# Patient Record
Sex: Female | Born: 1940 | Race: White | Hispanic: No | Marital: Married | Smoking: Never smoker
Health system: Southern US, Community
[De-identification: ages and names within clinical notes are randomized; demographics above are authoritative.]

## PROBLEM LIST (undated history)

## (undated) DIAGNOSIS — C50919 Malignant neoplasm of unspecified site of unspecified female breast: Secondary | ICD-10-CM

## (undated) DIAGNOSIS — R0600 Dyspnea, unspecified: Secondary | ICD-10-CM

## (undated) DIAGNOSIS — I10 Essential (primary) hypertension: Secondary | ICD-10-CM

## (undated) DIAGNOSIS — F329 Major depressive disorder, single episode, unspecified: Secondary | ICD-10-CM

## (undated) DIAGNOSIS — Z8619 Personal history of other infectious and parasitic diseases: Secondary | ICD-10-CM

## (undated) DIAGNOSIS — F419 Anxiety disorder, unspecified: Secondary | ICD-10-CM

## (undated) DIAGNOSIS — E785 Hyperlipidemia, unspecified: Secondary | ICD-10-CM

## (undated) DIAGNOSIS — K8020 Calculus of gallbladder without cholecystitis without obstruction: Secondary | ICD-10-CM

## (undated) DIAGNOSIS — Z860100 Personal history of colon polyps, unspecified: Secondary | ICD-10-CM

## (undated) DIAGNOSIS — E119 Type 2 diabetes mellitus without complications: Secondary | ICD-10-CM

## (undated) DIAGNOSIS — K219 Gastro-esophageal reflux disease without esophagitis: Secondary | ICD-10-CM

## (undated) DIAGNOSIS — M1990 Unspecified osteoarthritis, unspecified site: Secondary | ICD-10-CM

## (undated) DIAGNOSIS — F32A Depression, unspecified: Secondary | ICD-10-CM

## (undated) DIAGNOSIS — Z9221 Personal history of antineoplastic chemotherapy: Secondary | ICD-10-CM

## (undated) DIAGNOSIS — C801 Malignant (primary) neoplasm, unspecified: Secondary | ICD-10-CM

## (undated) DIAGNOSIS — R197 Diarrhea, unspecified: Secondary | ICD-10-CM

## (undated) DIAGNOSIS — D649 Anemia, unspecified: Secondary | ICD-10-CM

## (undated) DIAGNOSIS — H25019 Cortical age-related cataract, unspecified eye: Secondary | ICD-10-CM

## (undated) DIAGNOSIS — M109 Gout, unspecified: Secondary | ICD-10-CM

## (undated) HISTORY — PX: CHOLECYSTECTOMY: SHX55

## (undated) HISTORY — PX: BUNIONECTOMY: SHX129

## (undated) HISTORY — PX: HAMMER TOE SURGERY: SHX385

## (undated) HISTORY — PX: ABDOMINAL HYSTERECTOMY: SHX81

---

## 1971-07-19 DIAGNOSIS — C801 Malignant (primary) neoplasm, unspecified: Secondary | ICD-10-CM

## 1971-07-19 HISTORY — DX: Malignant (primary) neoplasm, unspecified: C80.1

## 2009-07-18 DIAGNOSIS — C50919 Malignant neoplasm of unspecified site of unspecified female breast: Secondary | ICD-10-CM

## 2009-07-18 HISTORY — PX: MASTECTOMY: SHX3

## 2009-07-18 HISTORY — DX: Malignant neoplasm of unspecified site of unspecified female breast: C50.919

## 2010-07-18 HISTORY — PX: REDUCTION MAMMAPLASTY: SUR839

## 2011-01-16 ENCOUNTER — Ambulatory Visit: Payer: Self-pay | Admitting: Internal Medicine

## 2011-01-17 ENCOUNTER — Ambulatory Visit: Payer: Self-pay | Admitting: Internal Medicine

## 2011-02-01 LAB — CANCER ANTIGEN 27.29: CA 27.29: 35.9 U/mL (ref 0.0–38.6)

## 2011-02-16 ENCOUNTER — Ambulatory Visit: Payer: Self-pay | Admitting: Internal Medicine

## 2011-03-29 ENCOUNTER — Ambulatory Visit: Payer: Self-pay | Admitting: Internal Medicine

## 2011-07-25 ENCOUNTER — Ambulatory Visit: Payer: Self-pay | Admitting: Internal Medicine

## 2011-07-25 LAB — CBC CANCER CENTER
Basophil %: 0.5 %
Eosinophil #: 0.3 x10 3/mm (ref 0.0–0.7)
Eosinophil %: 5.3 %
HGB: 12.1 g/dL (ref 12.0–16.0)
Lymphocyte #: 1.7 x10 3/mm (ref 1.0–3.6)
Lymphocyte %: 27.5 %
MCHC: 32.6 g/dL (ref 32.0–36.0)
MCV: 78.3 fL — ABNORMAL LOW (ref 80–100)
Monocyte #: 0.2 x10 3/mm (ref 0.0–0.7)
Monocyte %: 3.3 %
Neutrophil #: 4 x10 3/mm (ref 1.4–6.5)
Neutrophil %: 63.4 %
RBC: 4.76 10*6/uL (ref 3.80–5.20)
RDW: 15.2 % — ABNORMAL HIGH (ref 11.5–14.5)
WBC: 6.2 x10 3/mm (ref 3.6–11.0)

## 2011-07-25 LAB — COMPREHENSIVE METABOLIC PANEL
Albumin: 3.6 g/dL (ref 3.4–5.0)
Calcium, Total: 8.7 mg/dL (ref 8.5–10.1)
Chloride: 105 mmol/L (ref 98–107)
Co2: 23 mmol/L (ref 21–32)
EGFR (African American): >60
Glucose: 204 mg/dL — ABNORMAL HIGH (ref 65–99)
Potassium: 4 mmol/L (ref 3.5–5.1)
Sodium: 138 mmol/L (ref 136–145)

## 2011-08-19 ENCOUNTER — Ambulatory Visit: Payer: Self-pay | Admitting: Internal Medicine

## 2011-12-08 ENCOUNTER — Ambulatory Visit: Payer: Self-pay | Admitting: Internal Medicine

## 2012-01-26 ENCOUNTER — Ambulatory Visit: Payer: Self-pay | Admitting: Internal Medicine

## 2012-01-26 LAB — CBC CANCER CENTER
Eosinophil %: 2.9 %
Lymphocyte #: 1 x10 3/mm (ref 1.0–3.6)
MCH: 26.5 pg (ref 26.0–34.0)
MCV: 81 fL (ref 80–100)
Monocyte #: 0.4 x10 3/mm (ref 0.2–0.9)
Monocyte %: 5.1 %
Neutrophil #: 6.1 x10 3/mm (ref 1.4–6.5)
Neutrophil %: 78.8 %
Platelet: 165 x10 3/mm (ref 150–440)
RDW: 17.7 % — ABNORMAL HIGH (ref 11.5–14.5)
WBC: 7.7 x10 3/mm (ref 3.6–11.0)

## 2012-01-26 LAB — COMPREHENSIVE METABOLIC PANEL
Albumin: 3.6 g/dL (ref 3.4–5.0)
Alkaline Phosphatase: 111 U/L (ref 50–136)
BUN: 26 mg/dL — ABNORMAL HIGH (ref 7–18)
Calcium, Total: 9.1 mg/dL (ref 8.5–10.1)
Co2: 20 mmol/L — ABNORMAL LOW (ref 21–32)
Creatinine: 1.15 mg/dL (ref 0.60–1.30)
EGFR (African American): 55 — ABNORMAL LOW
EGFR (Non-African Amer.): 48 — ABNORMAL LOW
Glucose: 128 mg/dL — ABNORMAL HIGH (ref 65–99)
Potassium: 4.4 mmol/L (ref 3.5–5.1)
SGOT(AST): 30 U/L (ref 15–37)
SGPT (ALT): 35 U/L
Sodium: 138 mmol/L (ref 136–145)
Total Protein: 7.7 g/dL (ref 6.4–8.2)

## 2012-02-16 ENCOUNTER — Ambulatory Visit: Payer: Self-pay | Admitting: Internal Medicine

## 2012-03-18 ENCOUNTER — Ambulatory Visit: Payer: Self-pay | Admitting: Internal Medicine

## 2012-04-17 ENCOUNTER — Ambulatory Visit: Payer: Self-pay | Admitting: Internal Medicine

## 2013-08-15 ENCOUNTER — Ambulatory Visit: Payer: Self-pay | Admitting: Family Medicine

## 2013-10-29 DIAGNOSIS — E785 Hyperlipidemia, unspecified: Secondary | ICD-10-CM | POA: Insufficient documentation

## 2013-10-29 DIAGNOSIS — I8393 Asymptomatic varicose veins of bilateral lower extremities: Secondary | ICD-10-CM | POA: Insufficient documentation

## 2013-10-29 DIAGNOSIS — Z8541 Personal history of malignant neoplasm of cervix uteri: Secondary | ICD-10-CM | POA: Insufficient documentation

## 2014-01-10 ENCOUNTER — Ambulatory Visit: Payer: Self-pay | Admitting: Gastroenterology

## 2014-01-13 LAB — PATHOLOGY REPORT

## 2014-03-13 DIAGNOSIS — M170 Bilateral primary osteoarthritis of knee: Secondary | ICD-10-CM | POA: Insufficient documentation

## 2014-06-20 DIAGNOSIS — H9193 Unspecified hearing loss, bilateral: Secondary | ICD-10-CM | POA: Insufficient documentation

## 2014-08-28 DIAGNOSIS — G8929 Other chronic pain: Secondary | ICD-10-CM | POA: Insufficient documentation

## 2014-09-11 ENCOUNTER — Ambulatory Visit: Payer: Self-pay | Admitting: Unknown Physician Specialty

## 2014-09-26 ENCOUNTER — Encounter: Payer: Self-pay | Admitting: Gastroenterology

## 2015-07-01 DIAGNOSIS — F418 Other specified anxiety disorders: Secondary | ICD-10-CM | POA: Insufficient documentation

## 2015-12-31 ENCOUNTER — Other Ambulatory Visit: Payer: Self-pay | Admitting: Family Medicine

## 2015-12-31 DIAGNOSIS — Z1231 Encounter for screening mammogram for malignant neoplasm of breast: Secondary | ICD-10-CM

## 2015-12-31 DIAGNOSIS — E669 Obesity, unspecified: Secondary | ICD-10-CM | POA: Insufficient documentation

## 2016-01-18 ENCOUNTER — Ambulatory Visit: Admission: RE | Admit: 2016-01-18 | Payer: Self-pay | Source: Ambulatory Visit

## 2016-01-28 ENCOUNTER — Ambulatory Visit
Admission: RE | Admit: 2016-01-28 | Discharge: 2016-01-28 | Disposition: A | Discharge status: Home / Self Care | Payer: Medicare Other | Source: Ambulatory Visit | Attending: Family Medicine | Admitting: Family Medicine

## 2016-01-28 DIAGNOSIS — Z1231 Encounter for screening mammogram for malignant neoplasm of breast: Secondary | ICD-10-CM | POA: Diagnosis not present

## 2016-01-28 HISTORY — DX: Malignant neoplasm of unspecified site of unspecified female breast: C50.919

## 2016-01-28 HISTORY — DX: Malignant (primary) neoplasm, unspecified: C80.1

## 2016-09-27 ENCOUNTER — Encounter
Admission: RE | Admit: 2016-09-27 | Discharge: 2016-09-27 | Disposition: A | Discharge status: Home / Self Care | Payer: Medicare Other | Source: Ambulatory Visit | Attending: Surgery | Admitting: Surgery

## 2016-09-27 DIAGNOSIS — Z01810 Encounter for preprocedural cardiovascular examination: Secondary | ICD-10-CM | POA: Diagnosis present

## 2016-09-27 DIAGNOSIS — I517 Cardiomegaly: Secondary | ICD-10-CM | POA: Insufficient documentation

## 2016-09-27 DIAGNOSIS — Z01812 Encounter for preprocedural laboratory examination: Secondary | ICD-10-CM | POA: Insufficient documentation

## 2016-09-27 DIAGNOSIS — M75121 Complete rotator cuff tear or rupture of right shoulder, not specified as traumatic: Secondary | ICD-10-CM | POA: Diagnosis not present

## 2016-09-27 HISTORY — DX: Depression, unspecified: F32.A

## 2016-09-27 HISTORY — DX: Type 2 diabetes mellitus without complications: E11.9

## 2016-09-27 HISTORY — DX: Major depressive disorder, single episode, unspecified: F32.9

## 2016-09-27 HISTORY — DX: Diarrhea, unspecified: R19.7

## 2016-09-27 HISTORY — DX: Dyspnea, unspecified: R06.00

## 2016-09-27 HISTORY — DX: Essential (primary) hypertension: I10

## 2016-09-27 HISTORY — DX: Gastro-esophageal reflux disease without esophagitis: K21.9

## 2016-09-27 LAB — BASIC METABOLIC PANEL
ANION GAP: 8 (ref 5–15)
BUN: 33 mg/dL — ABNORMAL HIGH (ref 6–20)
CALCIUM: 9.2 mg/dL (ref 8.9–10.3)
CO2: 21 mmol/L — AB (ref 22–32)
Chloride: 110 mmol/L (ref 101–111)
Creatinine, Ser: 1.1 mg/dL — ABNORMAL HIGH (ref 0.44–1.00)
GFR calc non Af Amer: 48 mL/min — ABNORMAL LOW (ref >60)
GFR, EST AFRICAN AMERICAN: 55 mL/min — AB (ref >60)
Glucose, Bld: 113 mg/dL — ABNORMAL HIGH (ref 65–99)
Potassium: 4 mmol/L (ref 3.5–5.1)
SODIUM: 139 mmol/L (ref 135–145)

## 2016-09-27 LAB — CBC
HEMATOCRIT: 39.4 % (ref 35.0–47.0)
HEMOGLOBIN: 12.8 g/dL (ref 12.0–16.0)
MCH: 26 pg (ref 26.0–34.0)
MCHC: 32.6 g/dL (ref 32.0–36.0)
MCV: 79.9 fL — ABNORMAL LOW (ref 80.0–100.0)
Platelets: 154 10*3/uL (ref 150–440)
RBC: 4.93 MIL/uL (ref 3.80–5.20)
RDW: 15.8 % — AB (ref 11.5–14.5)
WBC: 8.6 10*3/uL (ref 3.6–11.0)

## 2016-09-27 LAB — URINALYSIS, COMPLETE (UACMP) WITH MICROSCOPIC
Bilirubin Urine: NEGATIVE
GLUCOSE, UA: NEGATIVE mg/dL
Hgb urine dipstick: NEGATIVE
KETONES UR: NEGATIVE mg/dL
Nitrite: NEGATIVE
PH: 5 (ref 5.0–8.0)
Protein, ur: NEGATIVE mg/dL
SPECIFIC GRAVITY, URINE: 1.016 (ref 1.005–1.030)
SQUAMOUS EPITHELIAL / LPF: NONE SEEN

## 2016-09-27 LAB — TYPE AND SCREEN
ABO/RH(D): O NEG
ANTIBODY SCREEN: NEGATIVE

## 2016-09-27 LAB — SURGICAL PCR SCREEN
MRSA, PCR: NEGATIVE
STAPHYLOCOCCUS AUREUS: NEGATIVE

## 2016-09-27 LAB — PROTIME-INR
INR: 1.12
PROTHROMBIN TIME: 14.5 s (ref 11.4–15.2)

## 2016-09-27 NOTE — Patient Instructions (Signed)
Your procedure is scheduled on: 10/04/16 Tues Report to Same Day Surgery 2nd floor medical mall [facility] Entrance-take elevator on left to 2nd floor.  Check in with surgery information desk.) To find out your arrival time please call [phone] between 1PM - 3PM on 10/03/16 Mon  Remember: Instructions that are not followed completely may result in serious medical risk, up to and including death, or upon the discretion of your surgeon and anesthesiologist your surgery may need to be rescheduled.    _x___ 1. Do not eat food or drink liquids after midnight. No gum chewing or                              hard candies.     __x__ 2. No Alcohol for 24 hours before or after surgery.   __x__3. No Smoking for 24 prior to surgery.   ____  4. Bring all medications with you on the day of surgery if instructed.    __x__ 5. Notify your doctor if there is any change in your medical condition     (cold, fever, infections).     Do not wear jewelry, make-up, hairpins, clips or nail polish.  Do not wear lotions, powders, or perfumes. You may wear deodorant.  Do not shave 48 hours prior to surgery. Men may shave face and neck.  Do not bring valuables to the hospital.    [facility] is not responsible for any belongings or valuables.               Contacts, dentures or bridgework may not be worn into surgery.  Leave your suitcase in the car. After surgery it may be brought to your room.  For patients admitted to the hospital, discharge time is determined by your                       treatment team.   Patients discharged the day of surgery will not be allowed to drive home.  You will need someone to drive you home and stay with you the night of your procedure.    Please read over the following fact sheets that you were given:   [facility] Preparing for Surgery and or MRSA Information   _x___ Take anti-hypertensive (unless it includes a diuretic), cardiac, seizure, asthma,     anti-reflux and  psychiatric medicines. These include:  1. lisinopril (PRINIVIL,ZESTRIL  2.omeprazole (PRILOSEC  3.sertraline (ZOLOFT  4.  5.  6.  ____Fleets enema or Magnesium Citrate as directed.   _x___ Use CHG Soap or sage wipes as directed on instruction sheet   ____ Use inhalers on the day of surgery and bring to hospital day of surgery  __x__ Stop Metformin and Janumet 2 days prior to surgery.    ____ Take 1/2 of usual insulin dose the night before surgery and none on the morning     surgery.   _x___ Follow recommendations from Cardiologist, Pulmonologist or PCP regarding          stopping Aspirin, Coumadin, Pllavix ,Eliquis, Effient, or Pradaxa, and Pletal. Stop Aspirin until after surgery.  X____Stop Anti-inflammatories such as Advil, Aleve, Ibuprofen, Motrin, Naproxen, Naprosyn, Goodies powders or aspirin products. OK to take Tylenol and                          Celebrex.   _x___ Stop supplements until after surgery.  But may continue Vitamin D, Vitamin B,       and multivitamin.   ____ Bring C-Pap to the hospital.

## 2016-09-28 LAB — URINE CULTURE: Culture: <10000 — AB

## 2016-09-28 NOTE — Pre-Procedure Instructions (Signed)
MEDICAL CLEARANCE REQUEST/EKG,AS REQUESTED BY DR PENWARDEN,CALLED AND FAXED TO DR Kandice Robinsons. SPOKE WITH MEGAN AND DR Kandice Robinsons LEFT AND ANOTHER DOCTOR IN PRACTICE WILL ADDRESS. FAXED FYT TO DR Roland Rack

## 2016-10-03 MED ORDER — CEFAZOLIN SODIUM-DEXTROSE 2-4 GM/100ML-% IV SOLN
2.0000 g | Freq: Once | INTRAVENOUS | Status: AC
  Administered 2016-10-04: 2 g via INTRAVENOUS

## 2016-10-03 NOTE — Pre-Procedure Instructions (Signed)
CLEARED BY DR PARASCHOS LOW RISK 09/30/16

## 2016-10-04 ENCOUNTER — Inpatient Hospital Stay: Payer: Medicare Other | Admitting: Anesthesiology

## 2016-10-04 ENCOUNTER — Encounter
Admission: RE | Disposition: A | Discharge status: Home Health | Payer: Self-pay | Source: Ambulatory Visit | Attending: Surgery

## 2016-10-04 ENCOUNTER — Inpatient Hospital Stay: Payer: Medicare Other

## 2016-10-04 ENCOUNTER — Inpatient Hospital Stay
Admission: RE | Admit: 2016-10-04 | Discharge: 2016-10-05 | DRG: 483 | Disposition: A | Discharge status: Home Health | Payer: Medicare Other | Source: Ambulatory Visit | Attending: Surgery | Admitting: Surgery

## 2016-10-04 ENCOUNTER — Encounter: Payer: Self-pay | Admitting: *Deleted

## 2016-10-04 DIAGNOSIS — Z23 Encounter for immunization: Secondary | ICD-10-CM | POA: Diagnosis present

## 2016-10-04 DIAGNOSIS — Z7984 Long term (current) use of oral hypoglycemic drugs: Secondary | ICD-10-CM

## 2016-10-04 DIAGNOSIS — Z853 Personal history of malignant neoplasm of breast: Secondary | ICD-10-CM | POA: Diagnosis not present

## 2016-10-04 DIAGNOSIS — M19011 Primary osteoarthritis, right shoulder: Secondary | ICD-10-CM | POA: Diagnosis present

## 2016-10-04 DIAGNOSIS — Z96611 Presence of right artificial shoulder joint: Secondary | ICD-10-CM

## 2016-10-04 DIAGNOSIS — Z7982 Long term (current) use of aspirin: Secondary | ICD-10-CM

## 2016-10-04 DIAGNOSIS — Z79899 Other long term (current) drug therapy: Secondary | ICD-10-CM | POA: Diagnosis not present

## 2016-10-04 DIAGNOSIS — Z8249 Family history of ischemic heart disease and other diseases of the circulatory system: Secondary | ICD-10-CM | POA: Diagnosis not present

## 2016-10-04 DIAGNOSIS — I10 Essential (primary) hypertension: Secondary | ICD-10-CM | POA: Diagnosis present

## 2016-10-04 DIAGNOSIS — F329 Major depressive disorder, single episode, unspecified: Secondary | ICD-10-CM | POA: Diagnosis present

## 2016-10-04 DIAGNOSIS — Z8541 Personal history of malignant neoplasm of cervix uteri: Secondary | ICD-10-CM | POA: Diagnosis not present

## 2016-10-04 DIAGNOSIS — Z86010 Personal history of colonic polyps: Secondary | ICD-10-CM | POA: Diagnosis not present

## 2016-10-04 DIAGNOSIS — K219 Gastro-esophageal reflux disease without esophagitis: Secondary | ICD-10-CM | POA: Diagnosis present

## 2016-10-04 DIAGNOSIS — M75101 Unspecified rotator cuff tear or rupture of right shoulder, not specified as traumatic: Principal | ICD-10-CM | POA: Diagnosis present

## 2016-10-04 DIAGNOSIS — K5909 Other constipation: Secondary | ICD-10-CM | POA: Diagnosis present

## 2016-10-04 DIAGNOSIS — M25511 Pain in right shoulder: Secondary | ICD-10-CM | POA: Diagnosis present

## 2016-10-04 HISTORY — PX: REVERSE SHOULDER ARTHROPLASTY: SHX5054

## 2016-10-04 LAB — GLUCOSE, CAPILLARY
GLUCOSE-CAPILLARY: 128 mg/dL — AB (ref 65–99)
Glucose-Capillary: 148 mg/dL — ABNORMAL HIGH (ref 65–99)
Glucose-Capillary: 115 mg/dL — ABNORMAL HIGH (ref 65–99)
Glucose-Capillary: 126 mg/dL — ABNORMAL HIGH (ref 65–99)

## 2016-10-04 LAB — ABO/RH: ABO/RH(D): O NEG

## 2016-10-04 SURGERY — ARTHROPLASTY, SHOULDER, TOTAL, REVERSE
Anesthesia: Regional | Site: Shoulder | Laterality: Right | Wound class: Clean

## 2016-10-04 MED ORDER — PANTOPRAZOLE SODIUM 40 MG PO TBEC
40.0000 mg | DELAYED_RELEASE_TABLET | Freq: Two times a day (BID) | ORAL | Status: DC
  Administered 2016-10-04 – 2016-10-05 (×2): 40 mg via ORAL
  Filled 2016-10-04 (×2): qty 1

## 2016-10-04 MED ORDER — SERTRALINE HCL 50 MG PO TABS
150.0000 mg | ORAL_TABLET | Freq: Every day | ORAL | Status: DC
  Administered 2016-10-04 – 2016-10-05 (×2): 150 mg via ORAL
  Filled 2016-10-04 (×2): qty 1

## 2016-10-04 MED ORDER — DOCUSATE SODIUM 100 MG PO CAPS
100.0000 mg | ORAL_CAPSULE | Freq: Two times a day (BID) | ORAL | Status: DC
  Administered 2016-10-04 – 2016-10-05 (×2): 100 mg via ORAL
  Filled 2016-10-04 (×2): qty 1

## 2016-10-04 MED ORDER — PHENYLEPHRINE HCL 10 MG/ML IJ SOLN
INTRAMUSCULAR | Status: DC | PRN
  Administered 2016-10-04: 25 ug/min via INTRAVENOUS

## 2016-10-04 MED ORDER — FENTANYL CITRATE (PF) 250 MCG/5ML IJ SOLN
INTRAMUSCULAR | Status: AC
  Filled 2016-10-04: qty 5

## 2016-10-04 MED ORDER — METOCLOPRAMIDE HCL 5 MG/ML IJ SOLN: 5.0000 mg | Freq: Three times a day (TID) | INTRAMUSCULAR | Status: DC | PRN

## 2016-10-04 MED ORDER — ACETAMINOPHEN 325 MG PO TABS: 650.0000 mg | ORAL_TABLET | Freq: Four times a day (QID) | ORAL | Status: DC | PRN

## 2016-10-04 MED ORDER — LIDOCAINE HCL (PF) 1 % IJ SOLN
INTRAMUSCULAR | Status: DC | PRN
  Administered 2016-10-04: 1 mL via INTRADERMAL

## 2016-10-04 MED ORDER — LIDOCAINE HCL (PF) 2 % IJ SOLN
INTRAMUSCULAR | Status: AC
  Filled 2016-10-04: qty 2

## 2016-10-04 MED ORDER — LIDOCAINE HCL (CARDIAC) 20 MG/ML IV SOLN
INTRAVENOUS | Status: DC | PRN
  Administered 2016-10-04 (×2): 100 mg via INTRAVENOUS

## 2016-10-04 MED ORDER — ACETAMINOPHEN 650 MG RE SUPP: 650.0000 mg | Freq: Four times a day (QID) | RECTAL | Status: DC | PRN

## 2016-10-04 MED ORDER — DIPHENHYDRAMINE HCL 12.5 MG/5ML PO ELIX: 12.5000 mg | ORAL_SOLUTION | ORAL | Status: DC | PRN

## 2016-10-04 MED ORDER — FENTANYL CITRATE (PF) 100 MCG/2ML IJ SOLN
50.0000 ug | Freq: Once | INTRAMUSCULAR | Status: AC
  Administered 2016-10-04: 50 ug via INTRAVENOUS

## 2016-10-04 MED ORDER — LIDOCAINE HCL (PF) 1 % IJ SOLN
INTRAMUSCULAR | Status: AC
  Filled 2016-10-04: qty 5

## 2016-10-04 MED ORDER — DIPHENOXYLATE-ATROPINE 2.5-0.025 MG PO TABS
1.0000 | ORAL_TABLET | Freq: Two times a day (BID) | ORAL | Status: DC
  Administered 2016-10-04 – 2016-10-05 (×2): 1 via ORAL
  Filled 2016-10-04 (×2): qty 1

## 2016-10-04 MED ORDER — POTASSIUM CHLORIDE IN NACL 20-0.9 MEQ/L-% IV SOLN
INTRAVENOUS | Status: DC
Start: 2016-10-04 — End: 2016-10-05
  Administered 2016-10-04 – 2016-10-05 (×2): via INTRAVENOUS
  Filled 2016-10-04 (×4): qty 1000

## 2016-10-04 MED ORDER — METFORMIN HCL 500 MG PO TABS
500.0000 mg | ORAL_TABLET | Freq: Every day | ORAL | Status: DC
  Administered 2016-10-04: 500 mg via ORAL
  Filled 2016-10-04: qty 1

## 2016-10-04 MED ORDER — LISINOPRIL 20 MG PO TABS
25.0000 mg | ORAL_TABLET | Freq: Every day | ORAL | Status: DC
  Administered 2016-10-04 – 2016-10-05 (×2): 25 mg via ORAL
  Filled 2016-10-04 (×2): qty 1

## 2016-10-04 MED ORDER — ROCURONIUM BROMIDE 100 MG/10ML IV SOLN
INTRAVENOUS | Status: DC | PRN
  Administered 2016-10-04: 20 mg via INTRAVENOUS
  Administered 2016-10-04: 50 mg via INTRAVENOUS

## 2016-10-04 MED ORDER — VITAMIN A 8000 UNITS PO CAPS: 8000.0000 [IU] | ORAL_CAPSULE | Freq: Every day | ORAL | Status: DC

## 2016-10-04 MED ORDER — NEOMYCIN-POLYMYXIN B GU 40-200000 IR SOLN
Status: DC | PRN
  Administered 2016-10-04 (×2): 16 mL

## 2016-10-04 MED ORDER — PNEUMOCOCCAL VAC POLYVALENT 25 MCG/0.5ML IJ INJ
0.5000 mL | INJECTION | INTRAMUSCULAR | Status: AC
  Administered 2016-10-05: 0.5 mL via INTRAMUSCULAR
  Filled 2016-10-04: qty 0.5

## 2016-10-04 MED ORDER — VITAMIN E 180 MG (400 UNIT) PO CAPS
400.0000 [IU] | ORAL_CAPSULE | Freq: Every day | ORAL | Status: DC
  Administered 2016-10-04: 400 [IU] via ORAL
  Filled 2016-10-04: qty 1

## 2016-10-04 MED ORDER — ROCURONIUM BROMIDE 50 MG/5ML IV SOLN
INTRAVENOUS | Status: AC
  Filled 2016-10-04: qty 1

## 2016-10-04 MED ORDER — ROPIVACAINE HCL 5 MG/ML IJ SOLN
INTRAMUSCULAR | Status: AC
  Filled 2016-10-04: qty 40

## 2016-10-04 MED ORDER — MIDAZOLAM HCL 2 MG/2ML IJ SOLN
1.0000 mg | Freq: Once | INTRAMUSCULAR | Status: AC
  Administered 2016-10-04: 1 mg via INTRAVENOUS

## 2016-10-04 MED ORDER — PROPOFOL 10 MG/ML IV BOLUS
INTRAVENOUS | Status: DC | PRN
Start: 2016-10-04 — End: 2016-10-04
  Administered 2016-10-04: 50 mg via INTRAVENOUS
  Administered 2016-10-04: 150 mg via INTRAVENOUS

## 2016-10-04 MED ORDER — TRANEXAMIC ACID 1000 MG/10ML IV SOLN
INTRAVENOUS | Status: DC | PRN
  Administered 2016-10-04: 1000 mg via INTRAVENOUS

## 2016-10-04 MED ORDER — ZINC SULFATE 220 (50 ZN) MG PO CAPS
220.0000 mg | ORAL_CAPSULE | Freq: Every day | ORAL | Status: DC
  Administered 2016-10-04: 220 mg via ORAL
  Filled 2016-10-04 (×2): qty 1

## 2016-10-04 MED ORDER — MAGNESIUM HYDROXIDE 400 MG/5ML PO SUSP: 30.0000 mL | Freq: Every day | ORAL | Status: DC | PRN

## 2016-10-04 MED ORDER — ONDANSETRON HCL 4 MG/2ML IJ SOLN
INTRAMUSCULAR | Status: DC | PRN
  Administered 2016-10-04: 4 mg via INTRAVENOUS

## 2016-10-04 MED ORDER — SODIUM CHLORIDE 0.9 % IJ SOLN
INTRAMUSCULAR | Status: AC
  Filled 2016-10-04: qty 50

## 2016-10-04 MED ORDER — FENTANYL CITRATE (PF) 100 MCG/2ML IJ SOLN
INTRAMUSCULAR | Status: AC
  Administered 2016-10-04: 50 ug via INTRAVENOUS
  Filled 2016-10-04: qty 2

## 2016-10-04 MED ORDER — ENOXAPARIN SODIUM 40 MG/0.4ML ~~LOC~~ SOLN
40.0000 mg | SUBCUTANEOUS | Status: DC
  Administered 2016-10-05: 40 mg via SUBCUTANEOUS
  Filled 2016-10-04: qty 0.4

## 2016-10-04 MED ORDER — FLEET ENEMA 7-19 GM/118ML RE ENEM: 1.0000 | ENEMA | Freq: Once | RECTAL | Status: DC | PRN

## 2016-10-04 MED ORDER — HYDROMORPHONE HCL 1 MG/ML IJ SOLN
0.5000 mg | INTRAMUSCULAR | Status: DC | PRN
  Administered 2016-10-05: 1 mg via INTRAVENOUS
  Filled 2016-10-04: qty 1

## 2016-10-04 MED ORDER — FENTANYL CITRATE (PF) 100 MCG/2ML IJ SOLN: 25.0000 ug | INTRAMUSCULAR | Status: DC | PRN

## 2016-10-04 MED ORDER — MIDAZOLAM HCL 2 MG/2ML IJ SOLN
INTRAMUSCULAR | Status: AC
  Administered 2016-10-04: 1 mg via INTRAVENOUS
  Filled 2016-10-04: qty 2

## 2016-10-04 MED ORDER — INFLUENZA VAC SPLIT QUAD 0.5 ML IM SUSY
0.5000 mL | PREFILLED_SYRINGE | INTRAMUSCULAR | Status: AC
  Administered 2016-10-05: 0.5 mL via INTRAMUSCULAR
  Filled 2016-10-04: qty 0.5

## 2016-10-04 MED ORDER — SUGAMMADEX SODIUM 200 MG/2ML IV SOLN
INTRAVENOUS | Status: AC
  Filled 2016-10-04: qty 2

## 2016-10-04 MED ORDER — METOCLOPRAMIDE HCL 10 MG PO TABS: 5.0000 mg | ORAL_TABLET | Freq: Three times a day (TID) | ORAL | Status: DC | PRN

## 2016-10-04 MED ORDER — ACETAMINOPHEN 500 MG PO TABS
1000.0000 mg | ORAL_TABLET | Freq: Four times a day (QID) | ORAL | Status: AC
  Administered 2016-10-04 – 2016-10-05 (×4): 1000 mg via ORAL
  Filled 2016-10-04 (×4): qty 2

## 2016-10-04 MED ORDER — BUPIVACAINE-EPINEPHRINE (PF) 0.5% -1:200000 IJ SOLN
INTRAMUSCULAR | Status: DC | PRN
  Administered 2016-10-04: 30 mL via PERINEURAL

## 2016-10-04 MED ORDER — ONDANSETRON HCL 4 MG PO TABS: 4.0000 mg | ORAL_TABLET | Freq: Four times a day (QID) | ORAL | Status: DC | PRN

## 2016-10-04 MED ORDER — PHENYLEPHRINE HCL 10 MG/ML IJ SOLN
INTRAMUSCULAR | Status: DC | PRN
  Administered 2016-10-04 (×2): 100 ug via INTRAVENOUS
  Administered 2016-10-04 (×2): 200 ug via INTRAVENOUS

## 2016-10-04 MED ORDER — FENTANYL CITRATE (PF) 100 MCG/2ML IJ SOLN
INTRAMUSCULAR | Status: DC | PRN
  Administered 2016-10-04 (×2): 50 ug via INTRAVENOUS
  Administered 2016-10-04: 100 ug via INTRAVENOUS

## 2016-10-04 MED ORDER — ONDANSETRON HCL 4 MG/2ML IJ SOLN: 4.0000 mg | Freq: Four times a day (QID) | INTRAMUSCULAR | Status: DC | PRN

## 2016-10-04 MED ORDER — INSULIN ASPART 100 UNIT/ML ~~LOC~~ SOLN: 0.0000 [IU] | Freq: Every day | SUBCUTANEOUS | Status: DC

## 2016-10-04 MED ORDER — SODIUM CHLORIDE 0.9 % IV SOLN
INTRAVENOUS | Status: DC | PRN
  Administered 2016-10-04: 60 mL

## 2016-10-04 MED ORDER — SUGAMMADEX SODIUM 200 MG/2ML IV SOLN
INTRAVENOUS | Status: DC | PRN
  Administered 2016-10-04: 200 mg via INTRAVENOUS

## 2016-10-04 MED ORDER — ROPIVACAINE HCL 5 MG/ML IJ SOLN
INTRAMUSCULAR | Status: DC | PRN
  Administered 2016-10-04: 10 mL via PERINEURAL
  Administered 2016-10-04: 20 mL via PERINEURAL

## 2016-10-04 MED ORDER — OXYCODONE HCL 5 MG PO TABS: 5.0000 mg | ORAL_TABLET | Freq: Once | ORAL | Status: DC | PRN

## 2016-10-04 MED ORDER — VASOPRESSIN 20 UNIT/ML IV SOLN
INTRAVENOUS | Status: DC | PRN
  Administered 2016-10-04: 2 [IU] via INTRAVENOUS

## 2016-10-04 MED ORDER — INSULIN ASPART 100 UNIT/ML ~~LOC~~ SOLN
0.0000 [IU] | Freq: Three times a day (TID) | SUBCUTANEOUS | Status: DC
  Administered 2016-10-05 (×2): 2 [IU] via SUBCUTANEOUS
  Filled 2016-10-04 (×2): qty 2

## 2016-10-04 MED ORDER — BISACODYL 10 MG RE SUPP: 10.0000 mg | Freq: Every day | RECTAL | Status: DC | PRN

## 2016-10-04 MED ORDER — OXYCODONE HCL 5 MG/5ML PO SOLN: 5.0000 mg | Freq: Once | ORAL | Status: DC | PRN

## 2016-10-04 MED ORDER — SODIUM CHLORIDE 0.9 % IV SOLN
INTRAVENOUS | Status: DC
  Administered 2016-10-04 (×2): via INTRAVENOUS
  Administered 2016-10-04: 1000 mL via INTRAVENOUS

## 2016-10-04 MED ORDER — PROPOFOL 10 MG/ML IV BOLUS
INTRAVENOUS | Status: AC
  Filled 2016-10-04: qty 20

## 2016-10-04 MED ORDER — OXYCODONE HCL 5 MG PO TABS
5.0000 mg | ORAL_TABLET | ORAL | Status: DC | PRN
  Administered 2016-10-05 (×3): 10 mg via ORAL
  Administered 2016-10-05 (×2): 5 mg via ORAL
  Filled 2016-10-04: qty 1
  Filled 2016-10-04: qty 2
  Filled 2016-10-04: qty 1
  Filled 2016-10-04 (×2): qty 2

## 2016-10-04 MED ORDER — CEFAZOLIN SODIUM-DEXTROSE 2-3 GM-% IV SOLR
2.0000 g | Freq: Four times a day (QID) | INTRAVENOUS | Status: AC
  Administered 2016-10-04 – 2016-10-05 (×3): 2 g via INTRAVENOUS
  Filled 2016-10-04 (×3): qty 50

## 2016-10-04 MED ORDER — ONDANSETRON HCL 4 MG/2ML IJ SOLN
INTRAMUSCULAR | Status: AC
  Filled 2016-10-04: qty 2

## 2016-10-04 MED ORDER — ASPIRIN EC 81 MG PO TBEC
81.0000 mg | DELAYED_RELEASE_TABLET | Freq: Every day | ORAL | Status: DC
  Administered 2016-10-04: 81 mg via ORAL
  Filled 2016-10-04: qty 1

## 2016-10-04 MED ORDER — CEFAZOLIN SODIUM-DEXTROSE 2-4 GM/100ML-% IV SOLN: 2.0000 g | Freq: Four times a day (QID) | INTRAVENOUS | Status: DC

## 2016-10-04 SURGICAL SUPPLY — 79 items
BASEPLATE GLENOSPHERE 25 (Plate) ×2 IMPLANT
BASEPLATE GLENOSPHERE 25MM (Plate) ×1 IMPLANT
BEARING HUIMERAL STRL 44-36MM (Orthopedic Implant) ×1 IMPLANT
BIT DRILL F/CENTRAL SCRW 3.2 (BIT) ×1
BIT DRILL F/CENTRAL SCRW 3.2MM (BIT) ×1 IMPLANT
BIT DRILL TWIST 2.7 (BIT) ×2 IMPLANT
BIT DRILL TWIST 2.7MM (BIT) ×1
BLADE SAGITTAL WIDE XTHICK NO (BLADE) ×3 IMPLANT
CANISTER SUCT 1200ML W/VALVE (MISCELLANEOUS) ×3 IMPLANT
CANISTER SUCT 3000ML PPV (MISCELLANEOUS) ×6 IMPLANT
CATH TRAY METER 16FR LF (MISCELLANEOUS) ×3 IMPLANT
CHLORAPREP W/TINT 26ML (MISCELLANEOUS) ×3 IMPLANT
COOLER POLAR GLACIER W/PUMP (MISCELLANEOUS) ×3 IMPLANT
CRADLE LAMINECT ARM (MISCELLANEOUS) ×3 IMPLANT
DECANTER SPIKE VIAL GLASS SM (MISCELLANEOUS) ×9 IMPLANT
DRAPE IMP U-DRAPE 54X76 (DRAPES) ×6 IMPLANT
DRAPE INCISE IOBAN 66X45 STRL (DRAPES) ×6 IMPLANT
DRAPE INCISE IOBAN 66X60 STRL (DRAPES) ×3 IMPLANT
DRAPE SHEET LG 3/4 BI-LAMINATE (DRAPES) ×6 IMPLANT
DRAPE TABLE BACK 80X90 (DRAPES) ×3 IMPLANT
DRILL BIT F/CENTRAL SCRW 3.2MM (BIT) ×2
DRSG OPSITE POSTOP 4X8 (GAUZE/BANDAGES/DRESSINGS) ×3 IMPLANT
ELECT CAUTERY BLADE 6.4 (BLADE) ×3 IMPLANT
GLENOID SPHERE STD STRL 36MM (Orthopedic Implant) ×3 IMPLANT
GLOVE BIO SURGEON STRL SZ7.5 (GLOVE) ×12 IMPLANT
GLOVE BIO SURGEON STRL SZ8 (GLOVE) ×12 IMPLANT
GLOVE BIOGEL PI IND STRL 8 (GLOVE) ×1 IMPLANT
GLOVE BIOGEL PI INDICATOR 8 (GLOVE) ×2
GLOVE INDICATOR 8.0 STRL GRN (GLOVE) ×3 IMPLANT
GOWN STRL REUS W/ TWL LRG LVL3 (GOWN DISPOSABLE) ×1 IMPLANT
GOWN STRL REUS W/ TWL XL LVL3 (GOWN DISPOSABLE) ×1 IMPLANT
GOWN STRL REUS W/TWL LRG LVL3 (GOWN DISPOSABLE) ×2
GOWN STRL REUS W/TWL XL LVL3 (GOWN DISPOSABLE) ×2
HANDPIECE INTERPULSE COAX TIP (DISPOSABLE) ×2
HOOD PEEL AWAY FLYTE STAYCOOL (MISCELLANEOUS) ×9 IMPLANT
HUMERAL BEARING STRL 44-36MM (Orthopedic Implant) ×3 IMPLANT
KIT RM TURNOVER STRD PROC AR (KITS) ×3 IMPLANT
KIT STABILIZATION SHOULDER (MISCELLANEOUS) ×3 IMPLANT
MASK FACE SPIDER DISP (MASK) ×3 IMPLANT
NDL MAYO CATGUT SZ1 (NEEDLE)
NDL MAYO CATGUT SZ5 (NEEDLE)
NDL SAFETY 22GX1.5 (NEEDLE) ×3 IMPLANT
NDL SUT 5 .5 CRC TPR PNT MAYO (NEEDLE) IMPLANT
NEEDLE 18GX1X1/2 (RX/OR ONLY) (NEEDLE) ×3 IMPLANT
NEEDLE HYPO 25X1 1.5 SAFETY (NEEDLE) ×3 IMPLANT
NEEDLE MAYO CATGUT SZ1 (NEEDLE) IMPLANT
NEEDLE MAYO CATGUT SZ4 (NEEDLE) IMPLANT
NEEDLE SPNL 20GX3.5 QUINCKE YW (NEEDLE) ×3 IMPLANT
NS IRRIG 1000ML POUR BTL (IV SOLUTION) ×3 IMPLANT
PACK ARTHROSCOPY SHOULDER (MISCELLANEOUS) ×3 IMPLANT
PAD WRAPON POLAR SHDR UNIV (MISCELLANEOUS) IMPLANT
PAD WRAPON POLAR SHDR XLG (MISCELLANEOUS) ×1 IMPLANT
PIN THREADED REVERSE (PIN) ×3 IMPLANT
SCREW BONE CORT 6.5X35MM (Screw) ×3 IMPLANT
SCREW BONE LOCKING 4.75X30X3.5 (Screw) ×3 IMPLANT
SCREW BONE LOCKING 4.75X35X3.5 (Screw) ×6 IMPLANT
SCREW NON-LOCK 4.75MMX15MM (Screw) ×3 IMPLANT
SCREW NON-LOCK 4.75X20X3.5 (Screw) ×3 IMPLANT
SET HNDPC FAN SPRY TIP SCT (DISPOSABLE) ×1 IMPLANT
SLING ULTRA II M (MISCELLANEOUS) ×3 IMPLANT
SOL .9 NS 3000ML IRR  AL (IV SOLUTION) ×2
SOL .9 NS 3000ML IRR UROMATIC (IV SOLUTION) ×1 IMPLANT
SPONGE LAP 18X18 5 PK (GAUZE/BANDAGES/DRESSINGS) ×3 IMPLANT
STAPLER SKIN PROX 35W (STAPLE) ×3 IMPLANT
STEM HUMERAL STRL 12MMX83MM (Stem) ×3 IMPLANT
SUT ETHIBOND 0 MO6 C/R (SUTURE) ×3 IMPLANT
SUT ETHIBOND NAB CT1 #1 30IN (SUTURE) ×3 IMPLANT
SUT FIBERWIRE #2 38 BLUE 1/2 (SUTURE) ×12
SUT VIC AB 0 CT1 36 (SUTURE) ×6 IMPLANT
SUT VIC AB 2-0 CT1 27 (SUTURE) ×4
SUT VIC AB 2-0 CT1 TAPERPNT 27 (SUTURE) ×2 IMPLANT
SUTURE FIBERWR #2 38 BLUE 1/2 (SUTURE) ×4 IMPLANT
SYR 30ML LL (SYRINGE) ×6 IMPLANT
SYRINGE 10CC LL (SYRINGE) ×3 IMPLANT
TRAY HUM STD 44MM (Orthopedic Implant) ×3 IMPLANT
TUBE CONNECTING  6'X3/16 (MISCELLANEOUS) ×1
TUBE CONNECTING 6X3/16 (MISCELLANEOUS) ×2 IMPLANT
WRAPON POLAR PAD SHDR UNIV (MISCELLANEOUS)
WRAPON POLAR PAD SHDR XLG (MISCELLANEOUS) ×3

## 2016-10-04 NOTE — Op Note (Addendum)
10/04/2016  12:49 PM  Patient:   Gabriela Pham  Pre-Op Diagnosis:   Massive irreparable rotator cuff tear with cuff arthropathy, right shoulder.  Post-Op Diagnosis:   Same  Procedure:   Reverse right total shoulder arthroplasty.  Surgeon:   Pascal Lux, MD  Assistant:   Cameron Proud, PA-C  Anesthesia:   General endotracheal with an interscalene block placed preoperatively by the anesthesiologist.  Findings:   As above.  Complications:   None  EBL:  250 cc  Fluids:   250 cc crystalloid  UOP:  1250 cc  TT:   None  Drains:   None  Closure:   Staples  Implants:   All press-fit Biomet Comprehensive system with a #12 mini-humeral stem, a 44 mm humeral tray with a standard insert, and a mini-base plate with a 36 mm glenosphere.  Brief Clinical Note:   The patient is a 76 year old female with a long history of gradually worsening pain and weakness of her right shoulder. Her symptoms have progressed despite medications, activity modification, injections, etc. Her history and examination are consistent with a massive rotator cuff tear with cuff arthropathy, all of which was confirmed by MRI scan. The patient presents at this time for a reverse right total shoulder arthroplasty.  Procedure:   The patient was brought into the operating room and lain in the supine position on the OR table. After adequate IV sedation was achieved, an interscalene block was placed preoperatively by the anesthesiologist. The patient then underwent general endotracheal intubation and anesthesia before a Foley catheter was inserted and the patient repositioned in the beach chair position using the beach chair positioner. The right shoulder and upper extremity were prepped with ChloraPrep solution before being draped sterilely. Preoperative antibiotics were administered. A standard anterior approach to the shoulder was made through an approximately 4-5 inch incision. The incision was carried down  through the subcutaneous tissues to expose the deltopectoral fascia. The interval between the deltoid and pectoralis muscles was identified and this plane developed, retracting the cephalic vein laterally with the deltoid muscle. The conjoined tendon was identified. Its lateral margin was dissected and the Kolbel self-retraining retractor inserted. The "three sisters" were identified and cauterized. Bursal tissues were removed to improve visualization. The subscapularis tendon was noted to have been chronically ruptured and retracted medial to the coracoid process, so it was not addressed. The inferior capsule was released with care after identifying and protecting the axillary nerve. The proximal humeral cut was made at approximately 30 of retroversion using the extra-medullary guide.   Attention was redirected to the glenoid. The labrum was debrided circumferentially before the center of the glenoid was marked with electrocautery. The guidewire was drilled into the glenoid neck using the appropriate guide. After verifying its position, it was overreamed with the mini-baseplate reamer to create a flat surface. The permanent mini-baseplate was impacted into place. It was stabilized with a 35 x 6.5 mm central screw and four peripheral screws. Locking screws were placed superiorly and inferiorly while nonlocking screws were placed anteriorly and posteriorly. The permanent 36 mm glenosphere was then impacted into place and its Morse taper locking mechanism verified using manual distraction.  Attention was directed to the humeral side. The humeral canal was reamed sequentially beginning with the end-cutting reamer then progressing from a 4 mm reamer up to a 12 mm reamer. This provided excellent circumferential chatter. The canal was broached beginning with a #9 broach and progressing to a #12 broach. This was left  in place and a trial reduction performed using the standard trial humeral platform. The arm  demonstrated excellent range of motion as the hand could be brought across the chest to the opposite shoulder and brought to the top of the patient's head and to the patient's ear. The shoulder appeared stable throughout this range of motion. The joint was dislocated and the trial components removed. The permanent #12 mini-stem was impacted into place with care taken to maintain the appropriate version. The permanent 44 mm humeral platform with the standard insert was put together on the back table and impacted into place. Again, the [facility] taper locking mechanism was verified using manual distraction. The shoulder was relocated using two finger pressure and again placed through a range of motion with the findings as described above.  The wound was copiously irrigated with bacitracin saline solution using the jet lavage system before a total of 20 cc of Exparel diluted out to 60 cc with normal saline and 30 cc of 0.5% Sensorcaine with epinephrine was injected into the pericapsular and peri-incisional tissues to help with postoperative analgesia. Given the chronically torn retracted status of the subscapularis tendon, no attempt was made to repair this tendon. The deltopectoral interval was closed using #0 Vicryl interrupted sutures before the subcutaneous tissues were closed using 2-0 Vicryl interrupted sutures. The skin was closed using staples. Prior to closing the skin, 1 g of transexemic acid in 10 cc of normal saline was injected intra-articularly to help with postoperative bleeding. A sterile occlusive dressing was applied to the wound before the arm was placed into a shoulder immobilizer with an abduction pillow. A Polar Care system also was applied to the shoulder. The patient was then transferred back to a hospital bed before being awakened, extubated, and returned to the recovery room in satisfactory condition after tolerating the procedure well.

## 2016-10-04 NOTE — Anesthesia Post-op Follow-up Note (Cosign Needed)
Anesthesia QCDR form completed.        

## 2016-10-04 NOTE — Anesthesia Postprocedure Evaluation (Signed)
Anesthesia Post Note  Patient: Mamye Bolds  Procedure(s) Performed: Procedure(s) (LRB): REVERSE SHOULDER ARTHROPLASTY (Right)  Patient location during evaluation: PACU Anesthesia Type: Regional Level of consciousness: awake and alert Pain management: pain level controlled Vital Signs Assessment: post-procedure vital signs reviewed and stable Respiratory status: spontaneous breathing, nonlabored ventilation, respiratory function stable and patient connected to nasal cannula oxygen Cardiovascular status: blood pressure returned to baseline and stable Postop Assessment: no signs of nausea or vomiting Anesthetic complications: no     Last Vitals:  Vitals:   10/04/16 1504 10/04/16 1543  BP: (!) 115/44 113/60  Pulse: 73 69  Resp: 15 18  Temp: 36.5 C 36.7 C    Last Pain:  Vitals:   10/04/16 1543  TempSrc: Oral                 Precious Haws Piscitello

## 2016-10-04 NOTE — H&P (Signed)
Paper H&P to be scanned into permanent record. H&P reviewed and patient re-examined. No changes. 

## 2016-10-04 NOTE — Anesthesia Preprocedure Evaluation (Signed)
Anesthesia Evaluation  Patient identified by MRN, date of birth, ID band Patient awake    Reviewed: Allergy & Precautions, H&P , NPO status , Patient's Chart, lab work & pertinent test results  History of Anesthesia Complications Negative for: history of anesthetic complications  Airway Mallampati: III  TM Distance: <3 FB Neck ROM: limited    Dental  (+) Poor Dentition, Chipped, Missing, Partial Upper   Pulmonary neg pulmonary ROS, neg shortness of breath,    Pulmonary exam normal breath sounds clear to auscultation       Cardiovascular hypertension, (-) angina(-) Past MI and (-) DOE Normal cardiovascular exam Rhythm:regular Rate:Normal     Neuro/Psych PSYCHIATRIC DISORDERS Depression negative neurological ROS     GI/Hepatic Neg liver ROS, GERD  Controlled,  Endo/Other  negative endocrine ROSdiabetes, Type 2  Renal/GU      Musculoskeletal   Abdominal   Peds  Hematology negative hematology ROS (+)   Anesthesia Other Findings Past Medical History: 2011: Breast cancer ([place])     Comment: left-had chemo 1973: Cancer ([place])     Comment: cervical ca No date: Depression No date: Diabetes mellitus without complication (HCC) No date: Diarrhea No date: Dyspnea No date: GERD (gastroesophageal reflux disease) No date: Hypertension  Past Surgical History: No date: ABDOMINAL HYSTERECTOMY No date: BUNIONECTOMY No date: HAMMER TOE SURGERY Left 2011: MASTECTOMY Left     Comment: positve/had chemo 2012: REDUCTION MAMMAPLASTY Right  BMI    Body Mass Index:  40.34 kg/m      Reproductive/Obstetrics negative OB ROS                             Anesthesia Physical Anesthesia Plan  ASA: III  Anesthesia Plan: General ETT and Regional   Post-op Pain Management:  Regional for Post-op pain   Induction:   Airway Management Planned:   Additional Equipment:   Intra-op Plan:   Post-operative  Plan:   Informed Consent: I have reviewed the patients History and Physical, chart, labs and discussed the procedure including the risks, benefits and alternatives for the proposed anesthesia with the patient or authorized representative who has indicated his/her understanding and acceptance.   Dental Advisory Given  Plan Discussed with: Anesthesiologist, CRNA and Surgeon  Anesthesia Plan Comments: (History of left breast surgery with node resection on left side.  Surgeons working on the right side today.  Patient reports that this surgery was in 2009.  She reports no problems with lymphedema in that arm.  Multiple unsuccessful attempts to place IV in lower extremities.  Discussion with patient about hospital policy to not place IV's or BP cuffs on arm with node resection.  Plan to place IV in left arm and cuff on the leg.  Risks and benefits of this including lymphedema discussed with patient who voiced understanding.)        Anesthesia Quick Evaluation

## 2016-10-04 NOTE — Transfer of Care (Signed)
Immediate Anesthesia Transfer of Care Note  Patient: Gabriela Pham  Procedure(s) Performed: Procedure(s): REVERSE SHOULDER ARTHROPLASTY (Right)  Patient Location: PACU  Anesthesia Type:General  Level of Consciousness: awake, alert , oriented and patient cooperative  Airway & Oxygen Therapy: Patient Spontanous Breathing and Patient connected to nasal cannula oxygen  Post-op Assessment: Report given to RN and Post -op Vital signs reviewed and stable  Post vital signs: Reviewed and stable  Last Vitals:  Vitals:   10/04/16 0959 10/04/16 1004  BP: (!) 145/73 (!) 156/75  Pulse: 67 69  Resp: 17 19  Temp:      Last Pain:  Vitals:   10/04/16 0843  TempSrc: Oral         Complications: No apparent anesthesia complications

## 2016-10-04 NOTE — Anesthesia Procedure Notes (Signed)
Procedure Name: Intubation Date/Time: 10/04/2016 10:37 AM Performed by: Rosaria Ferries, Ravyn Nikkel Pre-anesthesia Checklist: Patient identified, Emergency Drugs available, Suction available and Patient being monitored Patient Re-evaluated:Patient Re-evaluated prior to inductionOxygen Delivery Method: Circle system utilized Preoxygenation: Pre-oxygenation with 100% oxygen Intubation Type: IV induction Laryngoscope Size: Mac and 3 Grade View: Grade I Tube size: 7.0 mm Number of attempts: 1 Placement Confirmation: ETT inserted through vocal cords under direct vision,  positive ETCO2 and breath sounds checked- equal and bilateral Secured at: 21 cm Tube secured with: Tape Dental Injury: Teeth and Oropharynx as per pre-operative assessment

## 2016-10-04 NOTE — NC FL2 (Signed)
[place] LEVEL OF CARE SCREENING TOOL     IDENTIFICATION  Patient Name: Gabriela Pham Birthdate: [birth year] Sex: female Admission Date (Current Location): 10/04/2016  [place] and [place] Number:  Engineering geologist and Address:  [facility], [address], [place], [place] [address]      Provider Number: [id number]  Attending Physician Name and Address:  Corky Mull, MD  Relative Name and Phone Number:       Current Level of Care: Hospital Recommended Level of Care: [place] Prior Approval Number:    Date Approved/Denied:   PASRR Number:  [id number])  Discharge Plan: SNF    Current Diagnoses: Patient Active Problem List   Diagnosis Date Noted  . 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    Orientation RESPIRATION BLADDER Height & Weight     Time, Self, Situation, Place  Normal External catheter Weight: 235 lb (106.6 kg) Height:  5\' 4"  (162.6 cm)  BEHAVIORAL SYMPTOMS/MOOD NEUROLOGICAL BOWEL NUTRITION STATUS   (None. )  (None. ) Incontinent Diet (Diet: Heart healthy/carb modified)  AMBULATORY STATUS COMMUNICATION OF NEEDS Skin   Extensive Assist Verbally Surgical wounds (Incision: Right Shoulder)                       Personal Care Assistance Level of Assistance  Bathing, Feeding, Dressing Bathing Assistance: Limited assistance Feeding assistance: Independent Dressing Assistance: Limited assistance     Functional Limitations Info  Hearing, Speech, Sight Sight Info: Adequate Hearing Info: Adequate Speech Info: Adequate    SPECIAL CARE FACTORS FREQUENCY  PT (By licensed PT), OT (By licensed OT)     PT Frequency:  (5) OT Frequency:  (5)            Contractures      Additional Factors Info  Code Status, Allergies, Insulin Sliding Scale Code Status Info:  (Full Code) Allergies Info:  (No Known Allergies)   Insulin Sliding Scale Info:  (NovoLog)        Current Medications (10/04/2016):  This is the current hospital active medication list Current Facility-Administered Medications  Medication Dose Route Frequency Provider Last Rate Last Dose  . 0.9 % NaCl with KCl 20 mEq/ L  infusion   Intravenous Continuous Corky Mull, MD 75 mL/hr at 10/04/16 1619    . acetaminophen (TYLENOL) tablet 650 mg  650 mg Oral Q6H PRN Corky Mull, MD       Or  . acetaminophen (TYLENOL) suppository 650 mg  650 mg Rectal Q6H PRN Corky Mull, MD      . acetaminophen (TYLENOL) tablet 1,000 mg  1,000 mg Oral Q6H Corky Mull, MD      . aspirin EC tablet 81 mg  81 mg Oral QHS Corky Mull, MD      . bisacodyl (DULCOLAX) suppository 10 mg  10 mg Rectal Daily PRN Corky Mull, MD      . ceFAZolin (ANCEF) IVPB 2 g/50 mL premix  2 g Intravenous Q6H Corky Mull, MD      . diphenhydrAMINE (BENADRYL) 12.5 MG/5ML elixir 12.5-25 mg  12.5-25 mg Oral Q4H PRN Corky Mull, MD      . diphenoxylate-atropine (LOMOTIL) 2.5-0.025 MG per tablet 1 tablet  1 tablet Oral BID Corky Mull, MD      . docusate sodium (COLACE) capsule 100 mg  100 mg Oral BID Corky Mull, MD      . [  START ON 10/05/2016] enoxaparin (LOVENOX) injection 40 mg  40 mg Subcutaneous Q24H Corky Mull, MD      . HYDROmorphone (DILAUDID) injection 0.5-1 mg  0.5-1 mg Intravenous Q2H PRN Corky Mull, MD      . insulin aspart (novoLOG) injection 0-15 Units  0-15 Units Subcutaneous TID WC Corky Mull, MD      . insulin aspart (novoLOG) injection 0-5 Units  0-5 Units Subcutaneous QHS Corky Mull, MD      . lisinopril (PRINIVIL,ZESTRIL) tablet 25 mg  25 mg Oral Daily Corky Mull, MD      . magnesium hydroxide (MILK OF MAGNESIA) suspension 30 mL  30 mL Oral Daily PRN Corky Mull, MD      . metFORMIN (GLUCOPHAGE) tablet 500 mg  500 mg Oral QHS Corky Mull, MD      . metoCLOPramide (REGLAN) tablet 5-10 mg  5-10 mg Oral Q8H PRN Corky Mull, MD       Or  . metoCLOPramide (REGLAN) injection 5-10 mg  5-10 mg  Intravenous Q8H PRN Corky Mull, MD      . ondansetron [employer]) tablet 4 mg  4 mg Oral Q6H PRN Corky Mull, MD       Or  . ondansetron (ZOFRAN) injection 4 mg  4 mg Intravenous Q6H PRN Corky Mull, MD      . oxyCODONE (Oxy IR/ROXICODONE) immediate release tablet 5-10 mg  5-10 mg Oral Q3H PRN Corky Mull, MD      . pantoprazole (PROTONIX) EC tablet 40 mg  40 mg Oral BID Corky Mull, MD      . ropivacaine (PF) 5 mg/mL (0.5%) (NAROPIN) 5 MG/ML injection           . sertraline (ZOLOFT) tablet 150 mg  150 mg Oral Daily Corky Mull, MD      . sodium phosphate (FLEET) 7-19 GM/118ML enema 1 enema  1 enema Rectal Once PRN Corky Mull, MD      . vitamin A capsule 8,000 Units  8,000 Units Oral QHS Corky Mull, MD      . vitamin E capsule 400 Units  400 Units Oral QHS Corky Mull, MD      . zinc sulfate capsule 220 mg  220 mg Oral QHS Corky Mull, MD         Discharge Medications: Please see discharge summary for a list of discharge medications.  Relevant Imaging Results:  Relevant Lab Results:   Additional Information  (SSN: [id number])  Danie Chandler, Student-Social Work

## 2016-10-04 NOTE — Anesthesia Procedure Notes (Signed)
Anesthesia Regional Block: Interscalene brachial plexus block   Pre-Anesthetic Checklist: ,, timeout performed, Correct Patient, Correct Site, Correct Laterality, Correct Procedure, Correct Position, site marked, Risks and benefits discussed,  Surgical consent,  Pre-op evaluation,  At surgeon's request and post-op pain management  Laterality: Upper and Right  Prep: chloraprep       Needles:  Injection technique: Single-shot  Needle Type: Stimiplex     Needle Length: 5cm  Needle Gauge: 22     Additional Needles:   Procedures: ultrasound guided,,,,,,,,  Narrative:  Start time: 10/04/2016 9:50 AM End time: 10/04/2016 9:53 AM Injection made incrementally with aspirations every 5 mL.  Performed by: Personally  Anesthesiologist: Katy Fitch K  Additional Notes: Patient endorses baseline weakness in right upper extremity   Functioning IV was confirmed and monitors were applied.  A 13mm 22ga Stimuplex needle was used. Sterile prep,hand hygiene and sterile gloves were used.  Negative aspiration and negative test dose prior to incremental administration of local anesthetic. The patient tolerated the procedure well with no immediate complications.

## 2016-10-05 ENCOUNTER — Encounter: Payer: Self-pay | Admitting: Surgery

## 2016-10-05 LAB — BASIC METABOLIC PANEL
ANION GAP: 5 (ref 5–15)
BUN: 26 mg/dL — ABNORMAL HIGH (ref 6–20)
CHLORIDE: 110 mmol/L (ref 101–111)
CO2: 22 mmol/L (ref 22–32)
CREATININE: 1 mg/dL (ref 0.44–1.00)
Calcium: 8.4 mg/dL — ABNORMAL LOW (ref 8.9–10.3)
GFR calc non Af Amer: 53 mL/min — ABNORMAL LOW (ref >60)
Glucose, Bld: 137 mg/dL — ABNORMAL HIGH (ref 65–99)
Potassium: 4.8 mmol/L (ref 3.5–5.1)
SODIUM: 137 mmol/L (ref 135–145)

## 2016-10-05 LAB — CBC WITH DIFFERENTIAL/PLATELET
BASOS ABS: 0 10*3/uL (ref 0–0.1)
BASOS PCT: 0 %
Eosinophils Absolute: 0.2 10*3/uL (ref 0–0.7)
Eosinophils Relative: 2 %
HCT: 33.8 % — ABNORMAL LOW (ref 35.0–47.0)
HEMOGLOBIN: 11.1 g/dL — AB (ref 12.0–16.0)
Lymphocytes Relative: 14 %
Lymphs Abs: 1.2 10*3/uL (ref 1.0–3.6)
MCH: 25.8 pg — ABNORMAL LOW (ref 26.0–34.0)
MCHC: 32.7 g/dL (ref 32.0–36.0)
MCV: 78.9 fL — ABNORMAL LOW (ref 80.0–100.0)
MONOS PCT: 5 %
Monocytes Absolute: 0.4 10*3/uL (ref 0.2–0.9)
NEUTROS ABS: 7.2 10*3/uL — AB (ref 1.4–6.5)
NEUTROS PCT: 79 %
Platelets: 158 10*3/uL (ref 150–440)
RBC: 4.28 MIL/uL (ref 3.80–5.20)
RDW: 16 % — ABNORMAL HIGH (ref 11.5–14.5)
WBC: 9.1 10*3/uL (ref 3.6–11.0)

## 2016-10-05 LAB — GLUCOSE, CAPILLARY
GLUCOSE-CAPILLARY: 130 mg/dL — AB (ref 65–99)
Glucose-Capillary: 131 mg/dL — ABNORMAL HIGH (ref 65–99)

## 2016-10-05 MED ORDER — OXYCODONE HCL 5 MG PO TABS: 5.0000 mg | ORAL_TABLET | ORAL | 0 refills | Status: DC | PRN

## 2016-10-05 NOTE — Care Management Note (Signed)
Case Management Note  Patient Details  Name: Gabriela Pham MRN: [id number] Date of Birth: 06/09/1941  Subjective/Objective:   POD # 1 . Spoke with patient to discuss discharge planning. She was hoping to go to SNF and was told by the PA that she could go to SNF.  Patient has not had a 3 night qualifying hospital stay and is ready for discharge today. Patient tearful and not happy but is agreeable to home health. [facility]erral to [facility] for [facility]OT. Patient uses no DME at baseline. She lives at home with her elderly husband and has very little support from her family due to their jobs. PCP is Dr. Kandice Robinsons.              Action/Plan: Home with [facility] and PT.   Expected Discharge Date:     10/05/2016             Expected Discharge Plan:  [place]  In-House Referral:     Discharge planning Services  CM Consult  Post Acute Care Choice:  Home Health Choice offered to:  Patient  DME Arranged:   / DME Agency:   /  HH Arranged:  PT, OT [place] Agency:  [facility]  Status of Service:  Completed, signed off  If discussed at [place] of Stay Meetings, dates discussed:    Additional Comments:  Jolly Mango, RN 10/05/2016, 10:52 AM 03/

## 2016-10-05 NOTE — Discharge Summary (Signed)
Physician Discharge Summary  Patient ID: Gabriela Pham MRN: [id number] DOB/AGE: 11-25-1940 76 y.o.  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  Discharge Diagnoses: Patient Active Problem List   Diagnosis Date Noted  . 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  Past Medical History:  Diagnosis Date  . Breast cancer ([place]) 2011   left-had chemo  . Cancer ([place]) 1973   cervical ca  . Depression   . Diabetes mellitus without complication ([place])   . Diarrhea   . Dyspnea   . GERD (gastroesophageal reflux disease)   . Hypertension      Transfusion: None   Consultants (if any):   Discharged Condition: Improved  Hospital Course: Gabriela Pham is an 76 y.o. female who was admitted 10/04/2016 with a diagnosis of massive irreparable rotator cuff tear with cuff arthropathy of the right shoulder and went to the operating room on 10/04/2016 and underwent the above named procedures.    Surgeries: Procedure(s): REVERSE SHOULDER ARTHROPLASTY on 10/04/2016 Patient tolerated the surgery well. Taken to PACU where she was stabilized and then transferred to the orthopedic floor.  Started on Lovenox 40mg  q 24 hrs. Foot pumps applied bilaterally at 80 mm. Heels elevated on bed with rolled towels. No evidence of DVT. Negative [place]. Physical therapy started on day #2 due to heavy sedation on POD1 for gait training and transfer. OT started day #1 for ADL and assisted devices.  Patient's IV and Foley were removed on POD1.  Implants: All press-fit [employer] Comprehensive system with a #12 mini-humeral stem, a 44 mm humeral tray with a standard insert, and a mini-base plate with a 36 mm  glenosphere.  She was given perioperative antibiotics:  Anti-infectives    Start     Dose/Rate Route Frequency Ordered Stop   10/04/16 1700  ceFAZolin (ANCEF) IVPB 2 g/50 mL premix     2 g 100 mL/hr over 30 Minutes Intravenous Every 6 hours 10/04/16 1557 10/05/16 0622   10/04/16 1600  ceFAZolin (ANCEF) IVPB 2g/100 mL premix  Status:  Discontinued     2 g 200 mL/hr over 30 Minutes Intravenous Every 6 hours 10/04/16 1548 10/04/16 1556   10/03/16 2215  ceFAZolin (ANCEF) IVPB 2g/100 mL premix     2 g 200 mL/hr over 30 Minutes Intravenous  Once 10/03/16 2208 10/04/16 1107    .  She was given sequential compression devices, early ambulation, and Lovenox for DVT prophylaxis.  She benefited maximally from the hospital stay and there were no complications.    Recent vital signs:  Vitals:   10/05/16 0035 10/05/16 0438  BP: (!) 134/47 (!) 128/54  Pulse: 73 80  Resp: 20 18  Temp: 98.1 F (36.7 C) 99 F (37.2 C)    Recent laboratory studies:  Lab Results  Component Value Date   HGB 11.1 (L) 10/05/2016   HGB 12.8 09/27/2016   HGB 11.5 (L) 01/26/2012   Lab Results  Component Value Date   WBC 9.1 10/05/2016   PLT 158 10/05/2016   Lab Results  Component Value Date   INR 1.12 09/27/2016   Lab Results  Component Value Date   NA 137 10/05/2016   K 4.8 10/05/2016   CL 110 10/05/2016   CO2 22 10/05/2016   BUN  26 (H) 10/05/2016   CREATININE 1.00 10/05/2016   GLUCOSE 137 (H) 10/05/2016    Discharge Medications:   Allergies as of 10/05/2016   No Known Allergies     Medication List    TAKE these medications   aspirin EC 81 MG tablet Take 81 mg by mouth at bedtime.   diphenoxylate-atropine 2.5-0.025 MG tablet Commonly known as:  LOMOTIL Take 1 tablet by mouth 2 (two) times daily.   lisinopril 5 MG tablet Commonly known as:  PRINIVIL,ZESTRIL Take 25 mg by mouth daily.   metFORMIN 500 MG tablet Commonly known as:  GLUCOPHAGE Take 500 mg by mouth at bedtime.    omeprazole 20 MG capsule Commonly known as:  PRILOSEC Take 20 mg by mouth daily.   oxyCODONE 5 MG immediate release tablet Commonly known as:  Oxy IR/ROXICODONE Take 1-2 tablets (5-10 mg total) by mouth every 4 (four) hours as needed for breakthrough pain.   sertraline 100 MG tablet Commonly known as:  ZOLOFT Take 150 mg by mouth daily.   vitamin A 8000 UNIT capsule Take 8,000 Units by mouth at bedtime.   vitamin E 400 UNIT capsule Take 400 Units by mouth at bedtime.   Zinc 30 MG Tabs Take 30 mg by mouth at bedtime.       Diagnostic Studies: Dg Shoulder Right Port  Result Date: 10/04/2016 CLINICAL DATA:  Right shoulder replacement. EXAM: PORTABLE RIGHT SHOULDER COMPARISON:  09/11/2014 FINDINGS: Complete right shoulder replacement noted. Limited two-view exam. Components appear aligned. No acute osseous or hardware abnormality. Overlying staples noted. AC joint degenerative change present. IMPRESSION: Right shoulder replacement.  Expected postoperative changes. AC joint degenerative change. Electronically Signed   By: Jerilynn Mages.  Shick M.D.   On: 10/04/2016 13:34   Disposition: Plan will be for discharge to SNF this afternoon pending progress with physical therapy.  Follow-up Information    Judson Roch, PA-C Follow up in 14 day(s).   Specialty:  Physician Assistant Why:  Electa Sniff information: [place] [id number] [phone]          Signed: Judson Roch PA-C 10/05/2016, 7:58 AM

## 2016-10-05 NOTE — Discharge Instructions (Signed)
Diet: As you were doing prior to hospitalization   Shower:  May shower but keep the wounds dry, use an occlusive plastic wrap, NO SOAKING IN TUB.  If the bandage gets wet, change with a clean dry gauze.  Dressing:  You may change your dressing as needed. Change the dressing with sterile gauze dressing.    Activity:  Increase activity slowly as tolerated, but follow the weight bearing instructions below.  No lifting or driving for 6 weeks.  Weight Bearing:   Non-weightbearing to the right arm.  To prevent constipation: you may use a stool softener such as -  Colace (over the counter) 100 mg by mouth twice a day  Drink plenty of fluids (prune juice may be helpful) and high fiber foods Miralax (over the counter) for constipation as needed.    Itching:  If you experience itching with your medications, try taking only a single pain pill, or even half a pain pill at a time.  You may take up to 10 pain pills per day, and you can also use benadryl over the counter for itching or also to help with sleep.   Precautions:  If you experience chest pain or shortness of breath - call 911 immediately for transfer to the hospital emergency department!!  If you develop a fever greater that 101 F, purulent drainage from wound, increased redness or drainage from wound, or calf pain-Call [facility]    DVT Prophylaxis:  Upon discharge, take aspirin 81mg  twice daily for the next 14 days.                                             Follow- Up Appointment:  Please call for an appointment to be seen in 2 weeks at [facility]

## 2016-10-05 NOTE — Progress Notes (Signed)
Patient is alert and oriented and able to verbalize needs. Medicated for 6/10 pain in R shoulder. Vital signs stable. Discharge instructions gone over with patient at this time. AVS, follow up appointments and hard script given to patient. Patient verbalized understanding of all discharge instructions and follow up care. PIV discontinued. Patient called husband to transport her home. Awaiting arrival of husband at this time.

## 2016-10-05 NOTE — Progress Notes (Signed)
  Subjective: 1 Day Post-Op Procedure(s) (LRB): REVERSE SHOULDER ARTHROPLASTY (Right) Patient reports pain as mild.   Patient is well, and has had no acute complaints or problems Plan is to go Skilled nursing facility after hospital stay. Negative for chest pain and shortness of breath Fever: no Gastrointestinal:Negative for nausea and vomiting  Objective: Vital signs in last 24 hours: Temp:  [97.1 F (36.2 C)-99 F (37.2 C)] 99 F (37.2 C) (03/21 0438) Pulse Rate:  [64-87] 80 (03/21 0438) Resp:  [15-20] 18 (03/21 0438) BP: (99-185)/(39-80) 128/54 (03/21 0438) SpO2:  [89 %-99 %] 89 % (03/21 0438) Weight:  [106.6 kg (235 lb)] 106.6 kg (235 lb) (03/20 0843)  Intake/Output from previous day:  Intake/Output Summary (Last 24 hours) at 10/05/16 0753 Last data filed at 10/05/16 0552  Gross per 24 hour  Intake          3116.25 ml  Output             1875 ml  Net          1241.25 ml    Intake/Output this shift: No intake/output data recorded.  Labs:  Recent Labs  10/05/16 0537  HGB 11.1*    Recent Labs  10/05/16 0537  WBC 9.1  RBC 4.28  HCT 33.8*  PLT 158    Recent Labs  10/05/16 0537  NA 137  K 4.8  CL 110  CO2 22  BUN 26*  CREATININE 1.00  GLUCOSE 137*  CALCIUM 8.4*   No results for input(s): LABPT, INR in the last 72 hours.   EXAM General - Patient is Alert, Appropriate and Oriented Extremity - ABD soft Intact pulses distally Incision: dressing C/D/I No cellulitis present Dressing/Incision - clean, dry, no drainage Motor Function - intact, moving foot and toes well on exam. Pt intact to light touch over the right deltoid, some areas of continued decrease in sensation in the right upper extremity.  Past Medical History:  Diagnosis Date  . Breast cancer ([place]) 2011   left-had chemo  . Cancer ([place]) 1973   cervical ca  . Depression   . Diabetes mellitus without complication ([place])   . Diarrhea   . Dyspnea   . GERD (gastroesophageal reflux  disease)   . Hypertension     Assessment/Plan: 1 Day Post-Op Procedure(s) (LRB): REVERSE SHOULDER ARTHROPLASTY (Right) Active Problems:   Status post reverse total shoulder replacement, right  Estimated body mass index is 40.34 kg/m as calculated from the following:   Height as of this encounter: 5\' 4"  (1.626 m).   Weight as of this encounter: 106.6 kg (235 lb). Advance diet Up with therapy D/C IV fluids   Labs reviewed this AM. Foley removed. Patient lives at home with demented husband, does not have any family close by to care for her at this time. Plan will be for possible discharge to SNF later today. PT to work with patient today, unable to assist patient yesterday due to sedation.  DVT Prophylaxis - Lovenox, Foot Pumps and TED hose Non-weightbearing to the right upper extremity.   Raquel James, PA-C [facility] 10/05/2016, 7:53 AM

## 2016-10-05 NOTE — Progress Notes (Signed)
Clinical Education officer, museum (CSW) received SNF consult. PT recommended no follow up. RN case manager aware of above. Please reconsult if future social work needs arise. CSW signing off.   [employer], LCSW [phone]

## 2016-10-05 NOTE — Evaluation (Signed)
Physical Therapy Evaluation Patient Details Name: Gabriela Pham MRN: [id number] DOB: [birth year] Today's Date: 10/05/2016   History of Present Illness  Pt is a 76 y/o F s/p reverse R total shoulder arthroplasty due to R RTC tear.  Pt's PMH includes breast cancer.  Clinical Impression  Patient is s/p above surgery.  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 She currently requires most assist with bed mobility (modA) but otherwise requires up to supervision for all other aspects of mobility.  She completed stair training. Pt scored a 22/24 on the [employer], indicating she has a low risk of falling.  She expresses some concern about the amount of physical assist available from her husband at d/c but says that she believes he will be able to assist her.  She will benefit from an OT Evaluation to address ADLs, safety and technique with bed mobility, and any other concerns.  As bed mobility will be addressed with OT, there are no further PT needs identified and PT will sign off.   Follow Up Recommendations No PT follow up    Equipment Recommendations  None recommended by PT    Recommendations for Other Services OT consult     Precautions / Restrictions Precautions Precautions: Shoulder Precaution Comments: No specific orders for precautions but assumed no ROM and assumed NWB RUE. Required Braces or Orthoses: Sling (shoulder abduction sling) Restrictions Weight Bearing Restrictions: Yes RUE Weight Bearing: Non weight bearing (Assumed.  No specific order.)      Mobility  Bed Mobility Overal bed mobility: Needs Assistance Bed Mobility: Supine to Sit     Supine to sit: Mod assist     General bed mobility comments: Max verbal cues for technique with HOB flat and no use bed rail to simulate home environment.  Pt requires mod assist to push up to sitting.  Transfers Overall transfer level: Modified  independent Equipment used: None             General transfer comment: No physical assist needed.  Min verbal cues to reach for steady chair with LUE when preparing to sit.  Ambulation/Gait Ambulation/Gait assistance: Modified independent (Device/Increase time) Ambulation Distance (Feet): 225 Feet Assistive device: None Gait Pattern/deviations: Step-through pattern   Gait velocity interpretation: at or above normal speed for age/gender General Gait Details: No unsteadiness noted.  Pt does require verbal cues to avoid bumping RUE into wall or obstacles in the hallway.    Stairs Stairs: Yes Stairs assistance: Supervision Stair Management: One rail Left;Step to pattern;Sideways;Forwards Number of Stairs: 3 General stair comments: Pt used L rail ascending steps forward with supervision and step to pattern for safety.  Instructed pt to turn sideways and hold onto L rail when descending with step to pattern.  Supervision for safety.  Wheelchair Mobility    Modified Rankin (Stroke Patients Only)       Balance Overall balance assessment: No apparent balance deficits (not formally assessed)                               Standardized Balance Assessment Standardized Balance Assessment : Dynamic Gait Index   Dynamic Gait Index Level Surface: Normal Change in Gait Speed: Normal Gait with Horizontal Head Turns: Normal Gait with Vertical Head Turns: Normal Gait and Pivot Turn: Normal Step Over Obstacle: Normal Step Around Obstacles: Normal Steps: Moderate Impairment Total Score: 22  Pertinent Vitals/Pain Pain Assessment: 0-10 Pain Score: 5  (down to a 3/10 at end of session) Pain Location: R shoulder Pain Descriptors / Indicators: Aching;Constant;Guarding;Grimacing Pain Intervention(s): Limited activity within patient's tolerance;Monitored during session;Repositioned;Other (comment) (polar care in place at end of sessoion)    [place]  expects to be discharged to:: Private residence Living Arrangements: Spouse/significant other Available Help at Discharge: Family;Available 24 hours/day Type of Home: House Home Access: Stairs to enter Entrance Stairs-Rails: Left Entrance Stairs-Number of Steps: 3 Home Layout: One level Home Equipment: Shower seat;Hand held shower head;Toilet riser;Walker - standard Additional Comments: Pt reports she lives at home with her husband who has dementia and uses a RW or cane to ambulate.  He will be able to provide limited physical assist.    Prior Function Level of Independence: Independent         Comments: Pt reports not using AD and denies any falls over the past 6 months.     Hand Dominance   Dominant Hand: Right    Extremity/Trunk Assessment   Upper Extremity Assessment Upper Extremity Assessment: Defer to OT evaluation (in shoulder abduction sling )    Lower Extremity Assessment Lower Extremity Assessment: Overall WFL for tasks assessed    Cervical / Trunk Assessment Cervical / Trunk Assessment: Normal  Communication   Communication: No difficulties  Cognition Arousal/Alertness: Awake/alert Behavior During Therapy: WFL for tasks assessed/performed Overall Cognitive Status: Within Functional Limits for tasks assessed                      General Comments General comments (skin integrity, edema, etc.): Pt scored a 22/24 on the [employer], indicating she has a low risk of falling.    Exercises Other Exercises Other Exercises: Pt encouraged to use stress ball to squeeze periodically throughout the day for decreased swelling.   Assessment/Plan    PT Assessment Patent does not need any further PT services  PT Problem List         PT Treatment Interventions      PT Goals (Current goals can be found in the Care Plan section)  Acute Rehab PT Goals Patient Stated Goal: was hoping to go to SNF before home PT Goal Formulation: All assessment and  education complete, DC therapy    Frequency     Barriers to discharge        Co-evaluation               End of Session Equipment Utilized During Treatment: Gait belt;Other (comment) (R shoulder abduction sling) Activity Tolerance: Patient tolerated treatment well Patient left: in chair;with call bell/phone within reach;with chair alarm set Nurse Communication: Mobility status PT Visit Diagnosis: Unsteadiness on feet (R26.81)         Time: 8527-7824 PT Time Calculation (min) (ACUTE ONLY): 31 min   Charges:   PT Evaluation $PT Eval Low Complexity: 1 Procedure PT Treatments $Gait Training: 8-22 mins   PT G CodesCollie Siad PT, DPT 10/05/2016, 12:54 PM

## 2016-10-05 NOTE — Progress Notes (Signed)
Pt was too sedated to get OOB last night. Full sensation and motor function in right arm. Foley catheter removed. Pain is under control.

## 2016-10-05 NOTE — Evaluation (Signed)
Occupational Therapy Evaluation Patient Details Name: Gabriela Pham MRN: [id number] DOB: 09/21/1940 Today's Date: 10/05/2016    History of Present Illness Pt is a 76 y/o F s/p reverse R total shoulder arthroplasty due to R RTC tear.  Pt's PMH includes breast cancer, DMII, depression, GERD, and HTN   Clinical Impression   Pt seen for OT evaluation this date s/p reverse R total shoulder arthroplasty due to R RTC tear, POD#1 at time of OT evaluation. Pt was independent with ADL, driving, and working at [employer] and is eager to return to PLOF. Pt presents with deficits in pain, strength/ROM, activity tolerance, knowledge of AE for ADL, increased need for assist for ADL. Pt will benefit from [facility] services to address noted impairments and functional deficits in order to maximize return to PLOF and minimize falls risk. Pt upset during session that she was not going to SNF, OT provided encouragement. By end of session, pt reported increased confidence in returning home.     Follow Up Recommendations  Home health OT    Equipment Recommendations  None recommended by OT    Recommendations for Other Services       Precautions / Restrictions Precautions Precautions: Shoulder Shoulder Interventions: Shoulder abduction pillow;Shoulder sling/immobilizer;At all times Precaution Comments: No specific orders for precautions but assumed no ROM and assumed NWB RUE. Required Braces or Orthoses: Sling (shoulder abduction sling) Restrictions Weight Bearing Restrictions: Yes RUE Weight Bearing: Non weight bearing (assumed; no specific order found)      Mobility Bed Mobility Overal bed mobility: Needs Assistance Bed Mobility: Supine to Sit;Sit to Supine     Supine to sit: Modified independent (Device/Increase time) Sit to supine: Modified independent (Device/Increase time)   General bed mobility comments: with education in compensatory strategies, pt able to perform sup<>sit  from bed and maintaining RUE NWB'ing  Transfers Overall transfer level: Modified independent Equipment used: None             General transfer comment: with education in compensatory strategies, pt able to perform sit<>stand transfers from bed and maintaining RUE NWB'ing    Balance Overall balance assessment: No apparent balance deficits (not formally assessed)                                       ADL Overall ADL's : Needs assistance/impaired Eating/Feeding: Sitting;Set up   Grooming: Set up;Sitting   Upper Body Bathing: Moderate assistance;Minimal assistance;Set up;Sitting;Adhering to UE precautions   Lower Body Bathing: Minimal assistance;Sitting/lateral leans;Sit to/from stand       Lower Body Dressing: Minimal assistance;Sitting/lateral leans;Sit to/from stand   Toilet Transfer: Supervision/safety;Comfort height toilet   Toileting- Clothing Manipulation and Hygiene: Supervision/safety;Sit to/from stand       Functional mobility during ADLs: Supervision/safety General ADL Comments: generally min A for LB ADL, pt reports confidence that spouse will be able to provide level of assist for LB ADL     Vision Baseline Vision/History: Wears glasses Wears Glasses: At all times Patient Visual Report: No change from baseline Vision Assessment?: No apparent visual deficits     Perception     Praxis Praxis Praxis tested?: Within functional limits    Pertinent Vitals/Pain Pain Assessment: 0-10 Pain Score: 5  Pain Location: R shoulder Pain Descriptors / Indicators: Aching;Constant;Guarding;Grimacing Pain Intervention(s): Limited activity within patient's tolerance;Monitored during session;Repositioned;Ice applied     Hand Dominance Right   Extremity/Trunk Assessment Upper  Extremity Assessment Upper Extremity Assessment: RUE deficits/detail;LUE deficits/detail RUE Deficits / Details: immobilized in shoulder abduction sling, grip strength  4-/5 RUE: Unable to fully assess due to immobilization LUE Deficits / Details: grossly 4-/5 except 3+/5 for L shoulder abduction/flexion - pt states, "I'll probably need that shoulder replaced at some point too"   Lower Extremity Assessment Lower Extremity Assessment: Defer to PT evaluation;Overall [facility] for tasks assessed   Cervical / Trunk Assessment Cervical / Trunk Assessment: Normal   Communication Communication Communication: No difficulties   Cognition Arousal/Alertness: Awake/alert Behavior During Therapy: WFL for tasks assessed/performed Overall Cognitive Status: Within Functional Limits for tasks assessed                     General Comments  Pt scored a 22/24 on the [employer], indicating she has a low risk of falling.    Exercises Exercises: Other exercises Other Exercises Other Exercises: pt educated in sling/polar care positioning/application as well as UB dressing strategies to support functional independence, pt verbalized understanding   Shoulder Instructions      Home Living Family/patient expects to be discharged to:: Private residence Living Arrangements: Spouse/significant other Available Help at Discharge: Family;Available 24 hours/day (limited physical assist) Type of Home: House Home Access: Stairs to enter [employer] of Steps: 3 Entrance Stairs-Rails: Left Home Layout: One level     Bathroom Shower/Tub: Tub/shower unit Shower/tub characteristics: Architectural technologist: Standard (with riser for elevation) Bathroom Accessibility: Yes How Accessible: Accessible via walker Home Equipment: [place] held shower head;Toilet riser;Walker - standard   Additional Comments: Pt reports she lives at home with her husband who has dementia and uses a RW or cane to ambulate.  He will be able to provide limited physical assist but is able to assist with don/doffing socks/shoes/LB dressing as needed, simple meal prep       Prior Functioning/Environment Level of Independence: Independent        Comments: Pt reports not using AD and denies any falls over the past 6 months.        OT Problem List:        OT Treatment/Interventions:      OT Goals(Current goals can be found in the care plan section) Acute Rehab OT Goals Patient Stated Goal: get better OT Goal Formulation: With patient Time For Goal Achievement: 10/12/16 Potential to Achieve Goals: Good  OT Frequency:     Barriers to D/C:            Co-evaluation              End of Session Equipment Utilized During Treatment: Gait belt;Other (comment) (R shoulder abduction sling)  Activity Tolerance: Patient tolerated treatment well Patient left: in bed;with call bell/phone within reach;with bed alarm set  OT Visit Diagnosis: Muscle weakness (generalized) (M62.81);Pain Pain - Right/Left: Right Pain - part of body: Shoulder                ADL either performed or assessed with clinical judgement  Time: 6568-1275 OT Time Calculation (min): 37 min Charges:  OT General Charges $OT Visit: 1 Procedure OT Evaluation $OT Eval Low Complexity: 1 Procedure OT Treatments $Self Care/Home Management : 23-37 mins G-Codes:     Jeni Salles, MPH, MS, OTR/L ascom [phone] 10/05/16, 2:13 PM

## 2016-10-06 LAB — SURGICAL PATHOLOGY

## 2017-03-06 DIAGNOSIS — K529 Noninfective gastroenteritis and colitis, unspecified: Secondary | ICD-10-CM | POA: Insufficient documentation

## 2017-09-13 DIAGNOSIS — K219 Gastro-esophageal reflux disease without esophagitis: Secondary | ICD-10-CM | POA: Insufficient documentation

## 2017-11-02 ENCOUNTER — Other Ambulatory Visit: Payer: Self-pay | Admitting: Family Medicine

## 2017-11-02 DIAGNOSIS — Z1231 Encounter for screening mammogram for malignant neoplasm of breast: Secondary | ICD-10-CM

## 2017-11-09 ENCOUNTER — Ambulatory Visit
Admission: RE | Admit: 2017-11-09 | Discharge: 2017-11-09 | Disposition: A | Discharge status: Home / Self Care | Payer: Medicare Other | Source: Ambulatory Visit | Attending: Family Medicine | Admitting: Family Medicine

## 2017-11-09 ENCOUNTER — Other Ambulatory Visit: Payer: Self-pay

## 2017-11-09 ENCOUNTER — Ambulatory Visit
Admission: EM | Admit: 2017-11-09 | Discharge: 2017-11-09 | Disposition: A | Discharge status: Home / Self Care | Payer: Medicare Other | Attending: Family Medicine | Admitting: Family Medicine

## 2017-11-09 ENCOUNTER — Encounter (INDEPENDENT_AMBULATORY_CARE_PROVIDER_SITE_OTHER): Payer: Self-pay

## 2017-11-09 DIAGNOSIS — Z1231 Encounter for screening mammogram for malignant neoplasm of breast: Secondary | ICD-10-CM | POA: Diagnosis not present

## 2017-11-09 DIAGNOSIS — Z9012 Acquired absence of left breast and nipple: Secondary | ICD-10-CM | POA: Insufficient documentation

## 2017-11-09 DIAGNOSIS — M109 Gout, unspecified: Secondary | ICD-10-CM | POA: Diagnosis not present

## 2017-11-09 HISTORY — DX: Gout, unspecified: M10.9

## 2017-11-09 HISTORY — DX: Personal history of antineoplastic chemotherapy: Z92.21

## 2017-11-09 MED ORDER — COLCHICINE 0.6 MG PO TABS: ORAL_TABLET | ORAL | 0 refills | Status: DC

## 2017-11-09 MED ORDER — ALLOPURINOL 100 MG PO TABS: 100.0000 mg | ORAL_TABLET | Freq: Every day | ORAL | 0 refills | Status: AC

## 2017-11-09 NOTE — ED Triage Notes (Signed)
Pt reports "I have the gout." Right ankle redness and pain, right 3rd toe swelling and pain. Just finished Colchicine a few weeks ago

## 2017-11-09 NOTE — ED Provider Notes (Signed)
[facility]    CSN: [id number] Arrival date & time: 11/09/17  3295  History   Chief Complaint Chief Complaint  Patient presents with  . Gout   HPI  77 year old female presents with a suspected gout flare.  Patient states that she recently had a gout flare and was treated by a tele-doc with colchicine.  She states that she had resolution.  On Tuesday, she had another flare.  She reports right ankle pain, swelling, redness and warmth.  Severe pain.  No recent fall, trauma, injury.  Worse with activity and palpation.  No relieving factors.  No other associated symptoms.  No other complaints.  Past Medical History:  Diagnosis Date  . Breast cancer ([place]) 2011   left-had chemo  . Cancer ([place]) 1973   cervical ca  . Depression   . Diabetes mellitus without complication ([place])   . Diarrhea   . Dyspnea   . GERD (gastroesophageal reflux disease)   . Gout   . Hypertension   . Personal history of chemotherapy     Patient Active Problem List   Diagnosis Date Noted  . 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 ABDOMINAL HYSTERECTOMY    . BUNIONECTOMY    . HAMMER TOE SURGERY Left   . MASTECTOMY Left 2011   positve/had chemo  . REDUCTION MAMMAPLASTY Right 2012  . REVERSE SHOULDER ARTHROPLASTY Right 10/04/2016   Procedure: REVERSE SHOULDER ARTHROPLASTY;  Surgeon: Corky Mull, MD;  Location: [facility] ORS;  Service: Orthopedics;  Laterality: Right;    OB History   None      Home Medications    Prior to Admission medications   Medication Sig Start Date End Date Taking? Authorizing Provider  allopurinol (ZYLOPRIM) 100 MG tablet Take 1 tablet (100 mg total) by mouth daily. 11/09/17   Coral Spikes, DO  colchicine 0.6 MG tablet 0.6 mg twice daily until flare resolves. Then take 0.6 mg daily for prophylaxis. 11/09/17   Coral Spikes, DO  diphenoxylate-atropine (LOMOTIL) 2.5-0.025 MG tablet Take 1  tablet by mouth 2 (two) times daily. 09/14/16   [provider]  lisinopril (PRINIVIL,ZESTRIL) 5 MG tablet Take 25 mg by mouth daily.  09/14/16   [provider]  metFORMIN (GLUCOPHAGE) 500 MG tablet Take 500 mg by mouth at bedtime. 08/15/16   [provider]  omeprazole (PRILOSEC) 20 MG capsule Take 20 mg by mouth daily. 09/14/16   [provider]  sertraline (ZOLOFT) 100 MG tablet Take 150 mg by mouth daily. 09/14/16   [provider]  vitamin A 8000 UNIT capsule Take 8,000 Units by mouth at bedtime.    [provider]  vitamin E 400 UNIT capsule Take 400 Units by mouth at bedtime.    [provider]  Zinc 30 MG TABS Take 30 mg by mouth at bedtime.    [provider]    Family History Family History  Problem Relation Age of Onset  . Breast cancer Neg Hx     Social History Social History   Tobacco Use  . Smoking status: Never Smoker  . Smokeless tobacco: Never Used  Substance Use Topics  . Alcohol use: No  . Drug use: No     Allergies   Patient has no known allergies.   Review of Systems Review of Systems  Constitutional: Negative.   Musculoskeletal:       Right ankle pain, redness,  swelling.   Physical Exam Triage Vital Signs ED Triage Vitals [11/09/17 0958]  Enc Vitals Group     BP 140/89     Pulse Rate 79     Resp 18     Temp 98 F (36.7 C)     Temp Source Oral     SpO2 (!) 18 %     Weight 230 lb (104.3 kg)     Height 5\' 3"  (1.6 m)     Head Circumference      Peak Flow      Pain Score 6     Pain Loc      Pain Edu?      Excl. in [place]?    Updated Vital Signs BP 140/89 (BP Location: Right Arm)   Pulse 79   Temp 98 F (36.7 C) (Oral)   Resp 18   Ht 5\' 3"  (1.6 m)   Wt 230 lb (104.3 kg)   SpO2 (!) 18%   BMI 40.74 kg/m   Physical Exam  Constitutional: She is oriented to person, place, and time. She appears well-developed. No distress.  Pulmonary/Chest: Effort normal. No respiratory  distress.  Musculoskeletal:  Patient with exquisite tenderness to palpation of the lateral malleolus.  Swelling noted.  Neurological: She is alert and oriented to person, place, and time.  Skin: Skin is warm. There is erythema.  Warmth and erythema around the right ankle, lateral malleolus.  Psychiatric: She has a normal mood and affect. Her behavior is normal.  Nursing note and vitals reviewed.  UC Treatments / Results  Labs (all labs ordered are listed, but only abnormal results are displayed) Labs Reviewed - No data to display  EKG None Radiology No results found.  Procedures Procedures (including critical care time)  Medications Ordered in UC Medications - No data to display   Initial Impression / Assessment and Plan / UC Course  I have reviewed the triage vital signs and the nursing notes.  Pertinent labs & imaging results that were available during my care of the patient were reviewed by me and considered in my medical decision making (see chart for details).     77 year old female presents with gout.  Treating with colchicine and placing on prophylactic colchicine following flare as well as allopurinol.  Final Clinical Impressions(s) / UC Diagnoses   Final diagnoses:  Acute gout of right ankle, unspecified cause    ED Discharge Orders        Ordered    colchicine 0.6 MG tablet     11/09/17 1025    allopurinol (ZYLOPRIM) 100 MG tablet  Daily     11/09/17 1025     Controlled Substance Prescriptions [place] Controlled Substance Registry consulted? Not Applicable   Coral Spikes, DO 11/09/17 1037

## 2018-08-08 ENCOUNTER — Other Ambulatory Visit: Payer: Self-pay | Admitting: Gerontology

## 2018-08-08 DIAGNOSIS — Z1231 Encounter for screening mammogram for malignant neoplasm of breast: Secondary | ICD-10-CM

## 2018-08-08 DIAGNOSIS — M1A09X0 Idiopathic chronic gout, multiple sites, without tophus (tophi): Secondary | ICD-10-CM | POA: Insufficient documentation

## 2018-08-21 DIAGNOSIS — E559 Vitamin D deficiency, unspecified: Secondary | ICD-10-CM | POA: Insufficient documentation

## 2018-11-12 ENCOUNTER — Ambulatory Visit: Payer: Medicare Other

## 2019-01-01 DIAGNOSIS — R531 Weakness: Secondary | ICD-10-CM | POA: Insufficient documentation

## 2019-01-07 ENCOUNTER — Other Ambulatory Visit: Payer: Self-pay | Admitting: Gerontology

## 2019-01-16 ENCOUNTER — Other Ambulatory Visit: Payer: Self-pay | Admitting: Gerontology

## 2019-01-16 DIAGNOSIS — T8543XA Leakage of breast prosthesis and implant, initial encounter: Secondary | ICD-10-CM

## 2019-01-31 ENCOUNTER — Other Ambulatory Visit: Payer: Self-pay | Admitting: Gerontology

## 2019-01-31 DIAGNOSIS — Z1231 Encounter for screening mammogram for malignant neoplasm of breast: Secondary | ICD-10-CM

## 2019-02-13 ENCOUNTER — Other Ambulatory Visit: Payer: Self-pay

## 2019-02-13 ENCOUNTER — Ambulatory Visit
Admission: RE | Admit: 2019-02-13 | Discharge: 2019-02-13 | Disposition: A | Discharge status: Home / Self Care | Payer: Medicare Other | Source: Ambulatory Visit | Attending: Gerontology | Admitting: Gerontology

## 2019-02-13 DIAGNOSIS — Z1231 Encounter for screening mammogram for malignant neoplasm of breast: Secondary | ICD-10-CM | POA: Insufficient documentation

## 2019-04-04 ENCOUNTER — Other Ambulatory Visit: Payer: Self-pay | Admitting: Gerontology

## 2019-04-04 DIAGNOSIS — T859XXA Unspecified complication of internal prosthetic device, implant and graft, initial encounter: Secondary | ICD-10-CM

## 2019-04-04 DIAGNOSIS — Z853 Personal history of malignant neoplasm of breast: Secondary | ICD-10-CM

## 2019-04-08 ENCOUNTER — Other Ambulatory Visit: Payer: Self-pay | Admitting: Gerontology

## 2019-04-08 ENCOUNTER — Other Ambulatory Visit: Payer: Self-pay

## 2019-04-08 ENCOUNTER — Ambulatory Visit
Admission: RE | Admit: 2019-04-08 | Discharge: 2019-04-08 | Disposition: A | Discharge status: Home / Self Care | Payer: Medicare Other | Source: Ambulatory Visit | Attending: Gerontology | Admitting: Gerontology

## 2019-04-08 DIAGNOSIS — Z853 Personal history of malignant neoplasm of breast: Secondary | ICD-10-CM

## 2019-04-08 DIAGNOSIS — T859XXA Unspecified complication of internal prosthetic device, implant and graft, initial encounter: Secondary | ICD-10-CM

## 2019-04-10 ENCOUNTER — Ambulatory Visit
Admission: RE | Admit: 2019-04-10 | Discharge: 2019-04-10 | Disposition: A | Discharge status: Home / Self Care | Payer: Medicare Other | Source: Ambulatory Visit | Attending: Gerontology | Admitting: Gerontology

## 2019-04-10 ENCOUNTER — Other Ambulatory Visit: Payer: Self-pay | Admitting: Gerontology

## 2019-04-10 DIAGNOSIS — Z853 Personal history of malignant neoplasm of breast: Secondary | ICD-10-CM | POA: Insufficient documentation

## 2019-04-10 DIAGNOSIS — T859XXA Unspecified complication of internal prosthetic device, implant and graft, initial encounter: Secondary | ICD-10-CM | POA: Insufficient documentation

## 2019-04-17 DIAGNOSIS — R42 Dizziness and giddiness: Secondary | ICD-10-CM | POA: Insufficient documentation

## 2019-05-03 ENCOUNTER — Other Ambulatory Visit
Admission: RE | Admit: 2019-05-03 | Discharge: 2019-05-03 | Disposition: A | Discharge status: Home / Self Care | Payer: Medicare Other | Source: Ambulatory Visit | Attending: Gastroenterology | Admitting: Gastroenterology

## 2019-05-03 DIAGNOSIS — R194 Change in bowel habit: Secondary | ICD-10-CM | POA: Insufficient documentation

## 2019-05-03 DIAGNOSIS — R197 Diarrhea, unspecified: Secondary | ICD-10-CM | POA: Diagnosis not present

## 2019-05-07 LAB — STOOL CULTURE: E coli, Shiga toxin Assay: NEGATIVE

## 2019-05-07 LAB — STOOL CULTURE REFLEX - CMPCXR

## 2019-05-07 LAB — STOOL CULTURE REFLEX - RSASHR

## 2019-05-10 LAB — PANCREATIC ELASTASE, FECAL: Pancreatic Elastase-1, Stool: >500 ug Elast./g (ref >200)

## 2019-05-28 ENCOUNTER — Other Ambulatory Visit
Admission: RE | Admit: 2019-05-28 | Discharge: 2019-05-28 | Disposition: A | Discharge status: Home / Self Care | Payer: Medicare Other | Source: Ambulatory Visit | Attending: General Surgery | Admitting: General Surgery

## 2019-05-28 DIAGNOSIS — Z20828 Contact with and (suspected) exposure to other viral communicable diseases: Secondary | ICD-10-CM | POA: Diagnosis not present

## 2019-05-28 DIAGNOSIS — Z01812 Encounter for preprocedural laboratory examination: Secondary | ICD-10-CM | POA: Insufficient documentation

## 2019-05-29 LAB — SARS CORONAVIRUS 2 (TAT 6-24 HRS): SARS Coronavirus 2: NEGATIVE

## 2019-05-30 ENCOUNTER — Encounter: Payer: Self-pay | Admitting: *Deleted

## 2019-05-31 ENCOUNTER — Ambulatory Visit: Payer: Medicare Other | Admitting: Anesthesiology

## 2019-05-31 ENCOUNTER — Encounter
Admission: RE | Disposition: A | Discharge status: Home / Self Care | Payer: Self-pay | Source: Home / Self Care | Attending: General Surgery

## 2019-05-31 ENCOUNTER — Encounter: Payer: Self-pay | Admitting: *Deleted

## 2019-05-31 ENCOUNTER — Other Ambulatory Visit: Payer: Self-pay

## 2019-05-31 ENCOUNTER — Ambulatory Visit
Admission: RE | Admit: 2019-05-31 | Discharge: 2019-05-31 | Disposition: A | Discharge status: Home / Self Care | Payer: Medicare Other | Attending: General Surgery | Admitting: General Surgery

## 2019-05-31 DIAGNOSIS — K5730 Diverticulosis of large intestine without perforation or abscess without bleeding: Secondary | ICD-10-CM | POA: Insufficient documentation

## 2019-05-31 DIAGNOSIS — E785 Hyperlipidemia, unspecified: Secondary | ICD-10-CM | POA: Diagnosis not present

## 2019-05-31 DIAGNOSIS — Z853 Personal history of malignant neoplasm of breast: Secondary | ICD-10-CM | POA: Insufficient documentation

## 2019-05-31 DIAGNOSIS — Z7984 Long term (current) use of oral hypoglycemic drugs: Secondary | ICD-10-CM | POA: Insufficient documentation

## 2019-05-31 DIAGNOSIS — F419 Anxiety disorder, unspecified: Secondary | ICD-10-CM | POA: Diagnosis not present

## 2019-05-31 DIAGNOSIS — H25019 Cortical age-related cataract, unspecified eye: Secondary | ICD-10-CM | POA: Insufficient documentation

## 2019-05-31 DIAGNOSIS — E1136 Type 2 diabetes mellitus with diabetic cataract: Secondary | ICD-10-CM | POA: Diagnosis not present

## 2019-05-31 DIAGNOSIS — K219 Gastro-esophageal reflux disease without esophagitis: Secondary | ICD-10-CM | POA: Insufficient documentation

## 2019-05-31 DIAGNOSIS — K529 Noninfective gastroenteritis and colitis, unspecified: Secondary | ICD-10-CM | POA: Insufficient documentation

## 2019-05-31 DIAGNOSIS — M1990 Unspecified osteoarthritis, unspecified site: Secondary | ICD-10-CM | POA: Diagnosis not present

## 2019-05-31 DIAGNOSIS — Z9221 Personal history of antineoplastic chemotherapy: Secondary | ICD-10-CM | POA: Insufficient documentation

## 2019-05-31 DIAGNOSIS — M109 Gout, unspecified: Secondary | ICD-10-CM | POA: Diagnosis not present

## 2019-05-31 DIAGNOSIS — Z8719 Personal history of other diseases of the digestive system: Secondary | ICD-10-CM | POA: Diagnosis not present

## 2019-05-31 DIAGNOSIS — F329 Major depressive disorder, single episode, unspecified: Secondary | ICD-10-CM | POA: Diagnosis not present

## 2019-05-31 DIAGNOSIS — Z79899 Other long term (current) drug therapy: Secondary | ICD-10-CM | POA: Insufficient documentation

## 2019-05-31 DIAGNOSIS — I10 Essential (primary) hypertension: Secondary | ICD-10-CM | POA: Insufficient documentation

## 2019-05-31 DIAGNOSIS — Z8541 Personal history of malignant neoplasm of cervix uteri: Secondary | ICD-10-CM | POA: Insufficient documentation

## 2019-05-31 HISTORY — DX: Personal history of colonic polyps: Z86.010

## 2019-05-31 HISTORY — DX: Personal history of other infectious and parasitic diseases: Z86.19

## 2019-05-31 HISTORY — DX: Personal history of colon polyps, unspecified: Z86.0100

## 2019-05-31 HISTORY — PX: COLONOSCOPY WITH PROPOFOL: SHX5780

## 2019-05-31 HISTORY — DX: Anemia, unspecified: D64.9

## 2019-05-31 HISTORY — DX: Anxiety disorder, unspecified: F41.9

## 2019-05-31 HISTORY — DX: Unspecified osteoarthritis, unspecified site: M19.90

## 2019-05-31 HISTORY — DX: Hyperlipidemia, unspecified: E78.5

## 2019-05-31 HISTORY — DX: Cortical age-related cataract, unspecified eye: H25.019

## 2019-05-31 HISTORY — DX: Calculus of gallbladder without cholecystitis without obstruction: K80.20

## 2019-05-31 LAB — GLUCOSE, CAPILLARY: Glucose-Capillary: 146 mg/dL — ABNORMAL HIGH (ref 70–99)

## 2019-05-31 SURGERY — COLONOSCOPY WITH PROPOFOL
Anesthesia: General

## 2019-05-31 MED ORDER — PROPOFOL 10 MG/ML IV BOLUS
INTRAVENOUS | Status: AC
  Filled 2019-05-31: qty 60

## 2019-05-31 MED ORDER — PROPOFOL 10 MG/ML IV BOLUS
INTRAVENOUS | Status: DC | PRN
  Administered 2019-05-31: 50 mg via INTRAVENOUS

## 2019-05-31 MED ORDER — PROPOFOL 500 MG/50ML IV EMUL
INTRAVENOUS | Status: AC
  Filled 2019-05-31: qty 200

## 2019-05-31 MED ORDER — PROPOFOL 500 MG/50ML IV EMUL
INTRAVENOUS | Status: DC | PRN
  Administered 2019-05-31: 150 ug/kg/min via INTRAVENOUS

## 2019-05-31 MED ORDER — SODIUM CHLORIDE 0.9 % IV SOLN
INTRAVENOUS | Status: DC
  Administered 2019-05-31: 08:00:00 1000 mL via INTRAVENOUS
  Administered 2019-05-31: 08:00:00 via INTRAVENOUS

## 2019-05-31 MED ORDER — EPHEDRINE SULFATE 50 MG/ML IJ SOLN
INTRAMUSCULAR | Status: DC | PRN
  Administered 2019-05-31: 10 mg via INTRAVENOUS

## 2019-05-31 MED ORDER — LIDOCAINE HCL (CARDIAC) PF 100 MG/5ML IV SOSY
PREFILLED_SYRINGE | INTRAVENOUS | Status: DC | PRN
  Administered 2019-05-31: 100 mg via INTRATRACHEAL

## 2019-05-31 MED ORDER — PHENYLEPHRINE HCL (PRESSORS) 10 MG/ML IV SOLN
INTRAVENOUS | Status: DC | PRN
  Administered 2019-05-31 (×3): 100 ug via INTRAVENOUS

## 2019-05-31 NOTE — Anesthesia Post-op Follow-up Note (Signed)
Anesthesia QCDR form completed.        

## 2019-05-31 NOTE — Op Note (Signed)
[facility] Gastroenterology Patient Name: Gabriela Pham Procedure Date: 05/31/2019 7:43 AM MRN: [id number] Account #: [id number] Date of Birth: 1941-01-01 Admit Type: Outpatient Age: 78 Room: [facility] ENDO ROOM 1 Gender: Female Note Status: Finalized Procedure:             Colonoscopy Indications:           Chronic diarrhea Providers:             Robert Bellow, MD Referring MD:          Toni Arthurs (Referring MD) Medicines:             Monitored Anesthesia Care Complications:         No immediate complications. Procedure:             Pre-Anesthesia Assessment:                        - Prior to the procedure, a History and Physical was                         performed, and patient medications, allergies and                         sensitivities were reviewed. The patient's tolerance                         of previous anesthesia was reviewed.                        - The risks and benefits of the procedure and the                         sedation options and risks were discussed with the                         patient. All questions were answered and informed                         consent was obtained.                        After obtaining informed consent, the colonoscope was                         passed under direct vision. Throughout the procedure,                         the patient's blood pressure, pulse, and oxygen                         saturations were monitored continuously. The                         Colonoscope was introduced through the anus and                         advanced to the the cecum, identified by appendiceal                         orifice and ileocecal valve. The colonoscopy was  somewhat difficult due to a tortuous colon. Successful                         completion of the procedure was aided by changing the                         patient to a supine position. The patient tolerated           the procedure well. The quality of the bowel                         preparation was excellent. Findings:      A few medium-mouthed diverticula were found in the sigmoid colon.      The retroflexed view of the distal rectum and anal verge was normal and       showed no anal or rectal abnormalities.      Cold biopsies of the ascending and descending colon as well as the       rectum were obtained to assess for microscopic colitis. Impression:            - Diverticulosis in the sigmoid colon.                        - The distal rectum and anal verge are normal on                         retroflexion view.                        - No specimens collected. Recommendation:        - Discharge patient to home (via wheelchair).                        - Telephone endoscopist for pathology results in 1                         week. Procedure Code(s):     --- Professional ---                        [phone], Colonoscopy, flexible; diagnostic, including                         collection of specimen(s) by brushing or washing, when                         performed (separate procedure) Diagnosis Code(s):     --- Professional ---                        K52.9, Noninfective gastroenteritis and colitis,                         unspecified                        K57.30, Diverticulosis of large intestine without                         perforation or abscess without bleeding CPT copyright 2019 American Medical Association. All rights reserved. The codes documented in this  report are preliminary and upon coder review may  be revised to meet current compliance requirements. Robert Bellow, MD 05/31/2019 8:55:37 AM This report has been signed electronically. Number of Addenda: 0 Note Initiated On: 05/31/2019 7:43 AM Scope Withdrawal Time: 0 hours 13 minutes 27 seconds  Total Procedure Duration: 0 hours 35 minutes 32 seconds       [facility]

## 2019-05-31 NOTE — H&P (Signed)
Gabriela Pham [place] [id number] 03/10/1941     HPI:  One year history of worsening diarrhea without mucous or blood. For colonoscopy.   Medications Prior to Admission  Medication Sig Dispense Refill Last Dose  . Cholecalciferol 50 MCG (2000 UT) TABS Take 2,000 Units by mouth daily.     . Cyanocobalamin (VITAMIN B 12 PO) Take 1,000 mcg by mouth daily.     Marland Kitchen dicyclomine (BENTYL) 10 MG capsule Take 10 mg by mouth 3 (three) times daily before meals.     . divalproex (DEPAKOTE) 250 MG DR tablet Take 250 mg by mouth 2 (two) times daily.     . empagliflozin (JARDIANCE) 10 MG TABS tablet Take 10 mg by mouth daily.     . Lactobacillus Probiotic TABS Take 1 capsule by mouth daily.     . rosuvastatin (CRESTOR) 10 MG tablet Take 10 mg by mouth daily.     Marland Kitchen allopurinol (ZYLOPRIM) 100 MG tablet Take 1 tablet (100 mg total) by mouth daily. 90 tablet 0   . colchicine 0.6 MG tablet 0.6 mg twice daily until flare resolves. Then take 0.6 mg daily for prophylaxis. 90 tablet 0   . diphenoxylate-atropine (LOMOTIL) 2.5-0.025 MG tablet Take 1 tablet by mouth 2 (two) times daily.  0   . lisinopril (PRINIVIL,ZESTRIL) 5 MG tablet Take 25 mg by mouth daily.   0   . metFORMIN (GLUCOPHAGE) 500 MG tablet Take 500 mg by mouth at bedtime.     Marland Kitchen omeprazole (PRILOSEC) 20 MG capsule Take 20 mg by mouth daily.  0   . sertraline (ZOLOFT) 100 MG tablet Take 150 mg by mouth daily.  0   . vitamin A 8000 UNIT capsule Take 8,000 Units by mouth at bedtime.     . vitamin E 400 UNIT capsule Take 400 Units by mouth at bedtime.     . Zinc 30 MG TABS Take 30 mg by mouth at bedtime.      No Known Allergies Past Medical History:  Diagnosis Date  . Anemia   . Anxiety   . Arthritis   . Breast cancer ([place]) 2011   left-had chemo  . Cancer ([place]) 1973   cervical ca  . Cataract cortical, senile   . Cholelithiasis without obstruction   . Depression   . Diabetes mellitus without complication ([place])   . Diarrhea   . Dyspnea   .  GERD (gastroesophageal reflux disease)   . Gout   . History of chicken pox   . History of colon polyps   . Hyperlipidemia   . Hypertension   . Personal history of chemotherapy    Past Surgical History:  Procedure Laterality Date  . ABDOMINAL HYSTERECTOMY    . BUNIONECTOMY    . CHOLECYSTECTOMY    . HAMMER TOE SURGERY Left   . MASTECTOMY Left 2011   positve/had chemo  . REDUCTION MAMMAPLASTY Right 2012  . REVERSE SHOULDER ARTHROPLASTY Right 10/04/2016   Procedure: REVERSE SHOULDER ARTHROPLASTY;  Surgeon: Corky Mull, MD;  Location: [facility] ORS;  Service: Orthopedics;  Laterality: Right;   Social History   Socioeconomic History  . Marital status: Married    Spouse name: Not on file  . Number of children: Not on file  . Years of education: Not on file  . Highest education level: Not on file  Occupational History  . Not on file  Social Needs  . Financial resource strain: Not on file  . Food insecurity    Worry: Not  on file    Inability: Not on file  . Transportation needs    Medical: Not on file    Non-medical: Not on file  Tobacco Use  . Smoking status: Never Smoker  . Smokeless tobacco: Never Used  Substance and Sexual Activity  . Alcohol use: No  . Drug use: No  . Sexual activity: Not on file  Lifestyle  . Physical activity    Days per week: Not on file    Minutes per session: Not on file  . Stress: Not on file  Relationships  . Social Herbalist on phone: Not on file    Gets together: Not on file    Attends religious service: Not on file    Active member of club or organization: Not on file    Attends meetings of clubs or organizations: Not on file    Relationship status: Not on file  . Intimate partner violence    Fear of current or ex partner: Not on file    Emotionally abused: Not on file    Physically abused: Not on file    Forced sexual activity: Not on file  Other Topics Concern  . Not on file  Social History Narrative  . Not on file    Social History   Social History Narrative  . Not on file     ROS: Negative.     PE: HEENT: Negative. Lungs: Clear. Cardio: RR.  Assessment/Plan:  Proceed with planned endoscopy.    Forest Gleason  05/31/2019

## 2019-05-31 NOTE — Anesthesia Postprocedure Evaluation (Signed)
Anesthesia Post Note  Patient: Izzibella Barfknecht  Procedure(s) Performed: COLONOSCOPY WITH PROPOFOL (N/A )  Patient location during evaluation: Endoscopy Anesthesia Type: General Level of consciousness: awake and alert Pain management: pain level controlled Vital Signs Assessment: post-procedure vital signs reviewed and stable Respiratory status: spontaneous breathing, nonlabored ventilation, respiratory function stable and patient connected to nasal cannula oxygen Cardiovascular status: blood pressure returned to baseline and stable Postop Assessment: no apparent nausea or vomiting Anesthetic complications: no     Last Vitals:  Vitals:   05/31/19 0910 05/31/19 0920  BP: (!) 105/52 (!) 114/57  Pulse: 63 (!) 59  Resp: (!) 21 19  Temp:    SpO2: 95% 96%    Last Pain:  Vitals:   05/31/19 0920  TempSrc:   PainSc: 0-No pain                 Precious Haws Kadelyn Dimascio

## 2019-05-31 NOTE — Anesthesia Preprocedure Evaluation (Signed)
Anesthesia Evaluation  Patient identified by MRN, date of birth, ID band Patient awake    Reviewed: Allergy & Precautions, H&P , NPO status , Patient's Chart, lab work & pertinent test results  History of Anesthesia Complications Negative for: history of anesthetic complications  Airway Mallampati: III  TM Distance: <3 FB Neck ROM: limited    Dental  (+) Chipped, Poor Dentition, Missing   Pulmonary neg shortness of breath,           Cardiovascular Exercise Tolerance: Good hypertension, (-) angina     Neuro/Psych PSYCHIATRIC DISORDERS negative neurological ROS  negative psych ROS   GI/Hepatic Neg liver ROS, GERD  Controlled and Medicated,  Endo/Other  diabetes, Type 2  Renal/GU negative Renal ROS  negative genitourinary   Musculoskeletal  (+) Arthritis ,   Abdominal   Peds  Hematology negative hematology ROS (+)   Anesthesia Other Findings Past Medical History: No date: Anemia No date: Anxiety No date: Arthritis 2011: Breast cancer ([place])     Comment:  left-had chemo 1973: Cancer ([place])     Comment:  cervical ca No date: Cataract cortical, senile No date: Cholelithiasis without obstruction No date: Depression No date: Diabetes mellitus without complication ([facility]) No date: Diarrhea No date: Dyspnea No date: GERD (gastroesophageal reflux disease) No date: Gout No date: History of chicken pox No date: History of colon polyps No date: Hyperlipidemia No date: Hypertension No date: Personal history of chemotherapy  Past Surgical History: No date: ABDOMINAL HYSTERECTOMY No date: BUNIONECTOMY No date: CHOLECYSTECTOMY No date: HAMMER TOE SURGERY; Left 2011: MASTECTOMY; Left     Comment:  positve/had chemo 2012: REDUCTION MAMMAPLASTY; Right 10/04/2016: REVERSE SHOULDER ARTHROPLASTY; Right     Comment:  Procedure: REVERSE SHOULDER ARTHROPLASTY;  Surgeon: Corky Mull, MD;  Location: [facility] ORS;   Service: Orthopedics;               Laterality: Right;     Reproductive/Obstetrics negative OB ROS                             Anesthesia Physical Anesthesia Plan  ASA: III  Anesthesia Plan: General   Post-op Pain Management:    Induction: Intravenous  PONV Risk Score and Plan: Propofol infusion and TIVA  Airway Management Planned: Natural Airway and Nasal Cannula  Additional Equipment:   Intra-op Plan:   Post-operative Plan:   Informed Consent: I have reviewed the patients History and Physical, chart, labs and discussed the procedure including the risks, benefits and alternatives for the proposed anesthesia with the patient or authorized representative who has indicated his/her understanding and acceptance.     Dental Advisory Given  Plan Discussed with: Anesthesiologist, CRNA and Surgeon  Anesthesia Plan Comments: (Patient consented for risks of anesthesia including but not limited to:  - adverse reactions to medications - risk of intubation if required - damage to teeth, lips or other oral mucosa - sore throat or hoarseness - Damage to heart, brain, lungs or loss of life  Patient voiced understanding.)        Anesthesia Quick Evaluation

## 2019-05-31 NOTE — Transfer of Care (Signed)
Immediate Anesthesia Transfer of Care Note  Patient: Gabriela Pham  Procedure(s) Performed: COLONOSCOPY WITH PROPOFOL (N/A )  Patient Location: Endoscopy Unit  Anesthesia Type:General  Level of Consciousness: awake, drowsy and patient cooperative  Airway & Oxygen Therapy: Patient Spontanous Breathing and Patient connected to face mask oxygen  Post-op Assessment: Report given to RN and Post -op Vital signs reviewed and stable  Post vital signs: Reviewed and stable  Last Vitals:  Vitals Value Taken Time  BP    Temp    Pulse    Resp    SpO2      Last Pain:  Vitals:   05/31/19 0746  TempSrc: Tympanic  PainSc: 0-No pain         Complications: No apparent anesthesia complications

## 2019-06-03 ENCOUNTER — Encounter: Payer: Self-pay | Admitting: General Surgery

## 2019-06-03 LAB — SURGICAL PATHOLOGY

## 2019-06-07 ENCOUNTER — Inpatient Hospital Stay
Admission: EM | Admit: 2019-06-07 | Discharge: 2019-06-11 | DRG: 071 | Disposition: A | Discharge status: Skilled Nursing Facility | Payer: Medicare Other | Attending: Internal Medicine | Admitting: Internal Medicine

## 2019-06-07 ENCOUNTER — Other Ambulatory Visit: Payer: Self-pay

## 2019-06-07 ENCOUNTER — Emergency Department: Payer: Medicare Other

## 2019-06-07 ENCOUNTER — Encounter: Payer: Self-pay | Admitting: Emergency Medicine

## 2019-06-07 DIAGNOSIS — G9340 Encephalopathy, unspecified: Secondary | ICD-10-CM | POA: Diagnosis present

## 2019-06-07 DIAGNOSIS — M109 Gout, unspecified: Secondary | ICD-10-CM | POA: Diagnosis not present

## 2019-06-07 DIAGNOSIS — R270 Ataxia, unspecified: Secondary | ICD-10-CM

## 2019-06-07 DIAGNOSIS — Z9012 Acquired absence of left breast and nipple: Secondary | ICD-10-CM

## 2019-06-07 DIAGNOSIS — F39 Unspecified mood [affective] disorder: Secondary | ICD-10-CM | POA: Diagnosis present

## 2019-06-07 DIAGNOSIS — K219 Gastro-esophageal reflux disease without esophagitis: Secondary | ICD-10-CM | POA: Diagnosis present

## 2019-06-07 DIAGNOSIS — E785 Hyperlipidemia, unspecified: Secondary | ICD-10-CM | POA: Diagnosis present

## 2019-06-07 DIAGNOSIS — E1151 Type 2 diabetes mellitus with diabetic peripheral angiopathy without gangrene: Secondary | ICD-10-CM | POA: Diagnosis present

## 2019-06-07 DIAGNOSIS — Z20828 Contact with and (suspected) exposure to other viral communicable diseases: Secondary | ICD-10-CM | POA: Diagnosis present

## 2019-06-07 DIAGNOSIS — Z7984 Long term (current) use of oral hypoglycemic drugs: Secondary | ICD-10-CM

## 2019-06-07 DIAGNOSIS — N179 Acute kidney failure, unspecified: Secondary | ICD-10-CM | POA: Diagnosis not present

## 2019-06-07 DIAGNOSIS — Z9221 Personal history of antineoplastic chemotherapy: Secondary | ICD-10-CM

## 2019-06-07 DIAGNOSIS — Z888 Allergy status to other drugs, medicaments and biological substances status: Secondary | ICD-10-CM

## 2019-06-07 DIAGNOSIS — G9341 Metabolic encephalopathy: Principal | ICD-10-CM | POA: Diagnosis present

## 2019-06-07 DIAGNOSIS — Z8541 Personal history of malignant neoplasm of cervix uteri: Secondary | ICD-10-CM

## 2019-06-07 DIAGNOSIS — M1990 Unspecified osteoarthritis, unspecified site: Secondary | ICD-10-CM | POA: Diagnosis present

## 2019-06-07 DIAGNOSIS — E119 Type 2 diabetes mellitus without complications: Secondary | ICD-10-CM

## 2019-06-07 DIAGNOSIS — E860 Dehydration: Secondary | ICD-10-CM | POA: Diagnosis present

## 2019-06-07 DIAGNOSIS — R197 Diarrhea, unspecified: Secondary | ICD-10-CM | POA: Diagnosis present

## 2019-06-07 DIAGNOSIS — I10 Essential (primary) hypertension: Secondary | ICD-10-CM | POA: Diagnosis present

## 2019-06-07 DIAGNOSIS — Z853 Personal history of malignant neoplasm of breast: Secondary | ICD-10-CM

## 2019-06-07 LAB — CBC WITH DIFFERENTIAL/PLATELET
Abs Immature Granulocytes: 0.05 10*3/uL (ref 0.00–0.07)
Basophils Absolute: 0 10*3/uL (ref 0.0–0.1)
Basophils Relative: 0 %
Eosinophils Absolute: 0.1 10*3/uL (ref 0.0–0.5)
Eosinophils Relative: 1 %
HCT: 38.2 % (ref 36.0–46.0)
Hemoglobin: 12.3 g/dL (ref 12.0–15.0)
Immature Granulocytes: 0 %
Lymphocytes Relative: 13 %
Lymphs Abs: 1.4 10*3/uL (ref 0.7–4.0)
MCH: 26.1 pg (ref 26.0–34.0)
MCHC: 32.2 g/dL (ref 30.0–36.0)
MCV: 81.1 fL (ref 80.0–100.0)
Monocytes Absolute: 0.9 10*3/uL (ref 0.1–1.0)
Monocytes Relative: 8 %
Neutro Abs: 8.9 10*3/uL — ABNORMAL HIGH (ref 1.7–7.7)
Neutrophils Relative %: 78 %
Platelets: 143 10*3/uL — ABNORMAL LOW (ref 150–400)
RBC: 4.71 MIL/uL (ref 3.87–5.11)
RDW: 15.8 % — ABNORMAL HIGH (ref 11.5–15.5)
WBC: 11.3 10*3/uL — ABNORMAL HIGH (ref 4.0–10.5)
nRBC: 0 % (ref 0.0–0.2)

## 2019-06-07 LAB — COMPREHENSIVE METABOLIC PANEL
ALT: 14 U/L (ref 0–44)
AST: 22 U/L (ref 15–41)
Albumin: 3.8 g/dL (ref 3.5–5.0)
Alkaline Phosphatase: 61 U/L (ref 38–126)
Anion gap: 12 (ref 5–15)
BUN: 24 mg/dL — ABNORMAL HIGH (ref 8–23)
CO2: 20 mmol/L — ABNORMAL LOW (ref 22–32)
Calcium: 9.1 mg/dL (ref 8.9–10.3)
Chloride: 104 mmol/L (ref 98–111)
Creatinine, Ser: 0.91 mg/dL (ref 0.44–1.00)
GFR calc Af Amer: >60 mL/min (ref >60)
GFR calc non Af Amer: >60 mL/min (ref >60)
Glucose, Bld: 163 mg/dL — ABNORMAL HIGH (ref 70–99)
Potassium: 4.1 mmol/L (ref 3.5–5.1)
Sodium: 136 mmol/L (ref 135–145)
Total Bilirubin: 1 mg/dL (ref 0.3–1.2)
Total Protein: 8 g/dL (ref 6.5–8.1)

## 2019-06-07 LAB — HEMOGLOBIN A1C
Hgb A1c MFr Bld: 6.5 % — ABNORMAL HIGH (ref 4.8–5.6)
Mean Plasma Glucose: 139.85 mg/dL

## 2019-06-07 LAB — URINALYSIS, COMPLETE (UACMP) WITH MICROSCOPIC
Bacteria, UA: NONE SEEN
Bilirubin Urine: NEGATIVE
Glucose, UA: NEGATIVE mg/dL
Hgb urine dipstick: NEGATIVE
Ketones, ur: NEGATIVE mg/dL
Leukocytes,Ua: NEGATIVE
Nitrite: NEGATIVE
Protein, ur: NEGATIVE mg/dL
Specific Gravity, Urine: 1.013 (ref 1.005–1.030)
Squamous Epithelial / HPF: NONE SEEN (ref 0–5)
pH: 5 (ref 5.0–8.0)

## 2019-06-07 LAB — GLUCOSE, CAPILLARY
Glucose-Capillary: 108 mg/dL — ABNORMAL HIGH (ref 70–99)
Glucose-Capillary: 108 mg/dL — ABNORMAL HIGH (ref 70–99)
Glucose-Capillary: 131 mg/dL — ABNORMAL HIGH (ref 70–99)

## 2019-06-07 MED ORDER — HYDROCODONE-ACETAMINOPHEN 5-325 MG PO TABS
1.0000 | ORAL_TABLET | Freq: Once | ORAL | Status: AC
  Administered 2019-06-07: 1 via ORAL
  Filled 2019-06-07: qty 1

## 2019-06-07 MED ORDER — COLCHICINE 0.6 MG PO TABS: 0.6000 mg | ORAL_TABLET | Freq: Every day | ORAL | Status: DC

## 2019-06-07 MED ORDER — ONDANSETRON HCL 4 MG/2ML IJ SOLN: 4.0000 mg | Freq: Four times a day (QID) | INTRAMUSCULAR | Status: DC | PRN

## 2019-06-07 MED ORDER — HYDROCODONE-ACETAMINOPHEN 5-325 MG PO TABS: 1.0000 | ORAL_TABLET | ORAL | Status: DC | PRN

## 2019-06-07 MED ORDER — ACETAMINOPHEN 325 MG PO TABS: 650.0000 mg | ORAL_TABLET | Freq: Four times a day (QID) | ORAL | Status: DC | PRN

## 2019-06-07 MED ORDER — DICYCLOMINE HCL 10 MG PO CAPS
10.0000 mg | ORAL_CAPSULE | Freq: Three times a day (TID) | ORAL | Status: DC
  Administered 2019-06-07 – 2019-06-11 (×12): 10 mg via ORAL
  Filled 2019-06-07 (×14): qty 1

## 2019-06-07 MED ORDER — INSULIN ASPART 100 UNIT/ML ~~LOC~~ SOLN
0.0000 [IU] | Freq: Three times a day (TID) | SUBCUTANEOUS | Status: DC
  Administered 2019-06-08 (×3): 1 [IU] via SUBCUTANEOUS
  Administered 2019-06-09: 09:00:00 2 [IU] via SUBCUTANEOUS
  Administered 2019-06-09 – 2019-06-10 (×2): 1 [IU] via SUBCUTANEOUS
  Filled 2019-06-07 (×6): qty 1

## 2019-06-07 MED ORDER — LISINOPRIL 5 MG PO TABS
25.0000 mg | ORAL_TABLET | Freq: Every day | ORAL | Status: DC
  Administered 2019-06-08 – 2019-06-11 (×4): 25 mg via ORAL
  Filled 2019-06-07: qty 3
  Filled 2019-06-07 (×3): qty 1

## 2019-06-07 MED ORDER — DIVALPROEX SODIUM 250 MG PO DR TAB
250.0000 mg | DELAYED_RELEASE_TABLET | Freq: Two times a day (BID) | ORAL | Status: DC
  Administered 2019-06-07 – 2019-06-11 (×8): 250 mg via ORAL
  Filled 2019-06-07 (×10): qty 1

## 2019-06-07 MED ORDER — COLCHICINE 0.6 MG PO TABS
0.6000 mg | ORAL_TABLET | Freq: Two times a day (BID) | ORAL | Status: DC
  Administered 2019-06-09 – 2019-06-10 (×3): 0.6 mg via ORAL
  Filled 2019-06-07 (×5): qty 1

## 2019-06-07 MED ORDER — KETOROLAC TROMETHAMINE 30 MG/ML IJ SOLN
15.0000 mg | Freq: Once | INTRAMUSCULAR | Status: AC
  Administered 2019-06-07: 15 mg via INTRAVENOUS

## 2019-06-07 MED ORDER — ENOXAPARIN SODIUM 40 MG/0.4ML ~~LOC~~ SOLN
40.0000 mg | SUBCUTANEOUS | Status: DC
  Administered 2019-06-07 – 2019-06-10 (×4): 40 mg via SUBCUTANEOUS
  Filled 2019-06-07 (×5): qty 0.4

## 2019-06-07 MED ORDER — ONDANSETRON HCL 4 MG/2ML IJ SOLN
4.0000 mg | Freq: Once | INTRAMUSCULAR | Status: AC
  Administered 2019-06-07: 17:00:00 4 mg via INTRAVENOUS
  Filled 2019-06-07: qty 2

## 2019-06-07 MED ORDER — ONDANSETRON HCL 4 MG PO TABS
4.0000 mg | ORAL_TABLET | Freq: Four times a day (QID) | ORAL | Status: DC | PRN
  Filled 2019-06-07: qty 1

## 2019-06-07 MED ORDER — KETOROLAC TROMETHAMINE 30 MG/ML IJ SOLN
15.0000 mg | Freq: Once | INTRAMUSCULAR | Status: DC
  Filled 2019-06-07: qty 1

## 2019-06-07 MED ORDER — ALLOPURINOL 100 MG PO TABS
100.0000 mg | ORAL_TABLET | Freq: Every day | ORAL | Status: DC
  Administered 2019-06-07 – 2019-06-11 (×5): 100 mg via ORAL
  Filled 2019-06-07 (×5): qty 1

## 2019-06-07 MED ORDER — SENNOSIDES-DOCUSATE SODIUM 8.6-50 MG PO TABS
1.0000 | ORAL_TABLET | Freq: Every evening | ORAL | Status: DC | PRN
  Filled 2019-06-07: qty 1

## 2019-06-07 MED ORDER — ACETAMINOPHEN 650 MG RE SUPP: 650.0000 mg | Freq: Four times a day (QID) | RECTAL | Status: DC | PRN

## 2019-06-07 MED ORDER — PANTOPRAZOLE SODIUM 40 MG PO TBEC
40.0000 mg | DELAYED_RELEASE_TABLET | Freq: Every day | ORAL | Status: DC
  Administered 2019-06-07 – 2019-06-11 (×5): 40 mg via ORAL
  Filled 2019-06-07 (×5): qty 1

## 2019-06-07 MED ORDER — INSULIN ASPART 100 UNIT/ML ~~LOC~~ SOLN: 0.0000 [IU] | Freq: Every day | SUBCUTANEOUS | Status: DC

## 2019-06-07 MED ORDER — MORPHINE SULFATE (PF) 4 MG/ML IV SOLN
4.0000 mg | Freq: Once | INTRAVENOUS | Status: AC
  Administered 2019-06-07: 17:00:00 4 mg via INTRAVENOUS
  Filled 2019-06-07: qty 1

## 2019-06-07 MED ORDER — SERTRALINE HCL 50 MG PO TABS
150.0000 mg | ORAL_TABLET | Freq: Every day | ORAL | Status: DC
  Administered 2019-06-07 – 2019-06-11 (×5): 150 mg via ORAL
  Filled 2019-06-07: qty 1
  Filled 2019-06-07 (×4): qty 3

## 2019-06-07 MED ORDER — COLCHICINE 0.6 MG PO TABS
0.6000 mg | ORAL_TABLET | Freq: Three times a day (TID) | ORAL | Status: AC
  Administered 2019-06-07 – 2019-06-08 (×3): 0.6 mg via ORAL
  Filled 2019-06-07 (×3): qty 1

## 2019-06-07 MED ORDER — DIPHENOXYLATE-ATROPINE 2.5-0.025 MG PO TABS: 1.0000 | ORAL_TABLET | Freq: Two times a day (BID) | ORAL | Status: DC | PRN

## 2019-06-07 MED ORDER — ROSUVASTATIN CALCIUM 10 MG PO TABS
10.0000 mg | ORAL_TABLET | Freq: Every day | ORAL | Status: DC
  Administered 2019-06-07 – 2019-06-11 (×5): 10 mg via ORAL
  Filled 2019-06-07 (×5): qty 1

## 2019-06-07 NOTE — H&P (Signed)
[facility] History and Physical   Patient: Gabriela Pham [id number]   PCP: Toni Arthurs, NP DOB: 1941/06/22   DOA: 06/07/2019   DOS: 06/07/2019   DOS: the patient was seen and examined on 06/07/2019  Patient coming from: The patient is coming from Home  Chief Complaint: Ankle injury and and left ankle pain  HPI: Gabriela Pham is a 78 y.o. female with Past medical history of breast cancer, cervical cancer, cholelithiasis, depression, type II DM, GERD, gout. Patient presents with complaints of injury and left ankle pain. She was trying to move herself up on Wednesday when she hit her left leg with that.  She had some mild initial pain. Next morning when she woke up she was in excruciating pain and she was unable to ambulate secondary to the pain.  She tried to take some home remedies but her pain was not improving and therefore she came to the hospital. At the time of my evaluation denies any complaints of nausea or vomiting chest pain or abdominal pain diarrhea or constipation. Mentions that she is compliant with her medication and there is no new medication that was started. No other trauma or injury reported.  No passing out episode reported. Patient does mention that she has history of gout and takes colchicine as needed for gout flareup.  ED Course: Presents with above complaint.  Initial work-up showed that the patient appears to have gout flareup.  Unable to ambulate at all in the ER despite multiple attempts and pain medications.  Husband and son are unable to take care of the patient with her disabilities for now.  Patient was referred for admission for pain control and treatment of gout.  At her baseline ambulates with assistance independent for most of her ADL;  manages her medication on her own.  Review of Systems: as mentioned in the history of present illness.  All other systems reviewed and are negative.  Past Medical History:  Diagnosis  Date   Anemia    Anxiety    Arthritis    Breast cancer ([place]) 2011   left-had chemo   Cancer ([place]) 1973   cervical ca   Cataract cortical, senile    Cholelithiasis without obstruction    Depression    Diabetes mellitus without complication (HCC)    Diarrhea    Dyspnea    GERD (gastroesophageal reflux disease)    Gout    History of chicken pox    History of colon polyps    Hyperlipidemia    Hypertension    Personal history of chemotherapy    Past Surgical History:  Procedure Laterality Date   ABDOMINAL HYSTERECTOMY     BUNIONECTOMY     CHOLECYSTECTOMY     COLONOSCOPY WITH PROPOFOL N/A 05/31/2019   Procedure: COLONOSCOPY WITH PROPOFOL;  Surgeon: Robert Bellow, MD;  Location: [facility] ENDOSCOPY;  Service: Endoscopy;  Laterality: N/A;   HAMMER TOE SURGERY Left    MASTECTOMY Left 2011   positve/had chemo   REDUCTION MAMMAPLASTY Right 2012   REVERSE SHOULDER ARTHROPLASTY Right 10/04/2016   Procedure: REVERSE SHOULDER ARTHROPLASTY;  Surgeon: Corky Mull, MD;  Location: [facility] ORS;  Service: Orthopedics;  Laterality: Right;   Social History:  reports that she has never smoked. She has never used smokeless tobacco. She reports that she does not drink alcohol or use drugs.  Allergies  Allergen Reactions   Metformin Diarrhea    Severe diarrhea   Family history reviewed and not pertinent Family History  Problem Relation Age of Onset   Breast cancer Neg Hx      Prior to Admission medications   Medication Sig Start Date End Date Taking? Authorizing Provider  allopurinol (ZYLOPRIM) 100 MG tablet Take 1 tablet (100 mg total) by mouth daily. 11/09/17  Yes Coral Spikes, DO  aspirin EC 81 MG tablet Take 81 mg by mouth daily.   Yes [provider]  Cholecalciferol 50 MCG (2000 UT) TABS Take 2,000 Units by mouth daily.   Yes [provider]  colchicine 0.6 MG tablet 0.6 mg twice daily until flare resolves. Then take 0.6 mg daily for  prophylaxis. 11/09/17  Yes Cook, Jayce G, DO  Cyanocobalamin (VITAMIN B 12 PO) Take 1,000 mcg by mouth daily.   Yes [provider]  dicyclomine (BENTYL) 10 MG capsule Take 10 mg by mouth 3 (three) times daily before meals.   Yes [provider]  divalproex (DEPAKOTE) 250 MG DR tablet Take 250 mg by mouth 2 (two) times daily.   Yes [provider]  empagliflozin (JARDIANCE) 10 MG TABS tablet Take 10 mg by mouth daily.   Yes [provider]  Lactobacillus Probiotic TABS Take 1 capsule by mouth daily.   Yes [provider]  lisinopril (ZESTRIL) 10 MG tablet Take 10 mg by mouth daily.  09/14/16  Yes [provider]  omeprazole (PRILOSEC) 20 MG capsule Take 20 mg by mouth daily. 09/14/16  Yes [provider]  rosuvastatin (CRESTOR) 10 MG tablet Take 10 mg by mouth daily.   Yes [provider]  sertraline (ZOLOFT) 100 MG tablet Take 150 mg by mouth daily.  09/14/16  Yes [provider]  vitamin E 400 UNIT capsule Take 400 Units by mouth at bedtime.   Yes [provider]  diphenoxylate-atropine (LOMOTIL) 2.5-0.025 MG tablet Take 1 tablet by mouth 2 (two) times daily. 09/14/16   [provider]  metFORMIN (GLUCOPHAGE) 500 MG tablet Take 500 mg by mouth at bedtime. 08/15/16   [provider]  vitamin A 8000 UNIT capsule Take 8,000 Units by mouth at bedtime.    [provider]  Zinc 30 MG TABS Take 30 mg by mouth at bedtime.    [provider]    Physical Exam: Vitals:   06/07/19 1635 06/07/19 1749 06/07/19 1759 06/07/19 1819  BP: 107/66 (!) 110/59  123/67  Pulse: 72 82 68 77  Resp: (!) 21 (!) 28 17 18   Temp:    98.2 F (36.8 C)  TempSrc:    Oral  SpO2: 94% 94% 95% 94%  Weight:      Height:        General: alert and oriented to time, place, and person. Appear in moderate distress, affect appropriate Eyes: PERRL, Conjunctiva normal ENT: Oral Mucosa Clear, moist  Neck: no  JVD, no Abnormal Mass Or lumps Cardiovascular: S1 and S2 Present, no Murmur, peripheral pulses symmetrical Respiratory: good respiratory effort, Bilateral Air entry equal and Decreased, no signs of accessory muscle use, Clear to Auscultation, no Crackles, no wheezes Abdomen: Bowel Sound present, Soft and no tenderness, no hernia Skin: Left leg and foot redness with warmth  Extremities: Decreased range of motion of the left ankle secondary to pain. No other pedal edema, no calf tenderness Neurologic: without any new focal findings Gait not checked due to patient safety concerns  Data Reviewed: I have personally reviewed and interpreted labs, imaging as discussed below.  CBC: Recent Labs  Lab 06/07/19 [phone]  WBC 11.3*  NEUTROABS 8.9*  HGB 12.3  HCT 38.2  MCV 81.1  PLT [id number]*   Basic Metabolic Panel: Recent Labs  Lab 06/07/19 0948  NA 136  K 4.1  CL 104  CO2 20*  GLUCOSE 163*  BUN 24*  CREATININE 0.91  CALCIUM 9.1   GFR: Estimated Creatinine Clearance: 57 mL/min (by C-G formula based on SCr of 0.91 mg/dL). Liver Function Tests: Recent Labs  Lab 06/07/19 0948  AST 22  ALT 14  ALKPHOS 61  BILITOT 1.0  PROT 8.0  ALBUMIN 3.8   No results for input(s): LIPASE, AMYLASE in the last 168 hours. No results for input(s): AMMONIA in the last 168 hours. Coagulation Profile: No results for input(s): INR, PROTIME in the last 168 hours. Cardiac Enzymes: No results for input(s): CKTOTAL, CKMB, CKMBINDEX, TROPONINI in the last 168 hours. BNP (last 3 results) No results for input(s): PROBNP in the last 8760 hours. HbA1C: No results for input(s): HGBA1C in the last 72 hours. CBG: Recent Labs  Lab 06/07/19 1757 06/07/19 1825  GLUCAP 108* 108*   Lipid Profile: No results for input(s): CHOL, HDL, LDLCALC, TRIG, CHOLHDL, LDLDIRECT in the last 72 hours. Thyroid Function Tests: No results for input(s): TSH, T4TOTAL, FREET4, T3FREE, THYROIDAB in the last 72 hours. Anemia  Panel: No results for input(s): VITAMINB12, FOLATE, FERRITIN, TIBC, IRON, RETICCTPCT in the last 72 hours. Urine analysis:    Component Value Date/Time   COLORURINE YELLOW (A) 06/07/2019 1054   APPEARANCEUR CLEAR (A) 06/07/2019 1054   LABSPEC 1.013 06/07/2019 1054   PHURINE 5.0 06/07/2019 1054   GLUCOSEU NEGATIVE 06/07/2019 1054   HGBUR NEGATIVE 06/07/2019 1054   [place] 06/07/2019 1054   [place] 06/07/2019 1054   PROTEINUR NEGATIVE 06/07/2019 1054   NITRITE NEGATIVE 06/07/2019 1054   LEUKOCYTESUR NEGATIVE 06/07/2019 1054    Radiological Exams on Admission: Dg Chest Port 1 View  Result Date: 06/07/2019 CLINICAL DATA:  Weakness and confusion today. Hx of breast cancer, left mastectomy, diabetes, HTN. Nonsmoker. EXAM: PORTABLE CHEST - 1 VIEW COMPARISON:  none FINDINGS: Lungs are clear. Heart size and mediastinal contours are within normal limits. No effusion. Surgical clips project over the right lower chest. Right shoulder arthroplasty hardware partially visualized. IMPRESSION: No acute cardiopulmonary disease. Electronically Signed   By: Lucrezia Europe M.D.   On: 06/07/2019 10:16   Dg Foot 2 Views Left  Result Date: 06/07/2019 CLINICAL DATA:  Patient complains of medial left foot pain after moving a couch today. Hx of gout, dm, prior sx to left foot. Foot appeared red, swollen, and warm to the touch. EXAM: LEFT FOOT - 2 VIEW COMPARISON:  None. FINDINGS: No fracture or bone lesion. Previous bunion and hallux valgus surgery. Previous fusion of the PIP joint of the second toe. 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 There is degenerative spurring at the naviculocuneiform and cuneiform metatarsal articulations on the lateral view. Small apparent erosion along the distal medial aspect of the medial cuneiform at the first cuneiform metatarsal articulation. No other periarticular erosions. There is soft tissue swelling along the medial margin of the  midfoot. Moderate-sized plantar calcaneal spur. IMPRESSION: 1. No fracture or dislocation. 2. Periarticular erosion of the medial cuneiform at the medial cuneiform first metatarsal articulation. There is adjacent soft tissue swelling. Findings support gout. 3. No other evidence of an acute abnormality. Electronically Signed   By: Lajean Manes M.D.   On: 06/07/2019 15:56   I reviewed all nursing notes, pharmacy notes,  vitals, pertinent old records.  Assessment/Plan 1.  Acute gout. Pain control Presents with complaints of left ankle pain. Appears to have acute gout. We will treat with colchicine. Continue allopurinol. Pain controlled with Norco. PT consulted.  2.  Type 2 diabetes mellitus, controlled.  No complication. Holding oral hypoglycemic agent and placing the patient on sliding scale insulin. Check hemoglobin A1c.  3.  Mood disorder. Continue Depakote, Zoloft.  4.  Essential hypertension. Holding lisinopril for now.  5.  Chronic diarrhea. Patient is on scheduled Lomotil and Bentyl. Continue the same for now.  Nutrition: Cardiac diet and Carb modified diet DVT Prophylaxis: Subcutaneous Lovenox  Advance goals of care discussion: Full code   Consults: none   Family Communication: no family was present at bedside, at the time of interview.   Disposition: Admitted as observation med-surge unit. Likely to be discharged home, in 1 days.  I have discussed plan of care as described above with RN and patient/family.  Author: Berle Mull, MD [facility] 06/07/2019 6:31 PM   To reach On-call, see care teams to locate the attending and reach out to them via [email or web address]. If 7PM-7AM, please contact night-coverage If you still have difficulty reaching the attending provider, please page the [facility] (Director on Call) for [facility] on amion for assistance.

## 2019-06-07 NOTE — ED Notes (Signed)
Attempted to take pt to the bathroom in the room. Pt was able to sit on the side of the bed with minimal assistance but had pain in her L leg and was unable to stand on it. Dr. Corky Downs notified. See orders.

## 2019-06-07 NOTE — ED Provider Notes (Signed)
[facility] Emergency Department Provider Note   ____________________________________________    I have reviewed the triage vital signs and the nursing notes.   HISTORY  Chief Complaint Weakness     HPI Gabriela Pham is a 78 y.o. female with a history of diabetes, GERD, hypertension who presents with complaints of weakness and pain in her left foot.  Patient reports she had difficulty getting to the bathroom this morning because she injured her foot yesterday moving some furniture.  Patient's description raises the possibility of some confusion perhaps dementia.  Review of records demonstrate she does have a history of gout but she is unable to remember where she typically has gout flares.  Denies fevers or chills.  No cough.  No nausea or vomiting.  No chest pain.  No neuro deficits  Past Medical History:  Diagnosis Date  . Anemia   . Anxiety   . Arthritis   . Breast cancer ([place]) 2011   left-had chemo  . Cancer ([place]) 1973   cervical ca  . Cataract cortical, senile   . Cholelithiasis without obstruction   . Depression   . Diabetes mellitus without complication ([place])   . Diarrhea   . Dyspnea   . GERD (gastroesophageal reflux disease)   . Gout   . History of chicken pox   . History of colon polyps   . Hyperlipidemia   . Hypertension   . Personal history of chemotherapy     Patient Active Problem List   Diagnosis Date Noted  . 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 ABDOMINAL HYSTERECTOMY    . BUNIONECTOMY    . CHOLECYSTECTOMY    . COLONOSCOPY WITH PROPOFOL N/A 05/31/2019   Procedure: COLONOSCOPY WITH PROPOFOL;  Surgeon: Orah Sonnen Bellow, MD;  Location: [facility] ENDOSCOPY;  Service: Endoscopy;  Laterality: N/A;  . HAMMER TOE SURGERY Left   . MASTECTOMY Left 2011   positve/had chemo  . REDUCTION MAMMAPLASTY Right 2012  . REVERSE SHOULDER  ARTHROPLASTY Right 10/04/2016   Procedure: REVERSE SHOULDER ARTHROPLASTY;  Surgeon: Corky Mull, MD;  Location: [facility] ORS;  Service: Orthopedics;  Laterality: Right;    Prior to Admission medications   Medication Sig Start Date End Date Taking? Authorizing Provider  allopurinol (ZYLOPRIM) 100 MG tablet Take 1 tablet (100 mg total) by mouth daily. 11/09/17   Coral Spikes, DO  Cholecalciferol 50 MCG (2000 UT) TABS Take 2,000 Units by mouth daily.    [provider]  colchicine 0.6 MG tablet 0.6 mg twice daily until flare resolves. Then take 0.6 mg daily for prophylaxis. 11/09/17   Coral Spikes, DO  Cyanocobalamin (VITAMIN B 12 PO) Take 1,000 mcg by mouth daily.    [provider]  dicyclomine (BENTYL) 10 MG capsule Take 10 mg by mouth 3 (three) times daily before meals.    [provider]  diphenoxylate-atropine (LOMOTIL) 2.5-0.025 MG tablet Take 1 tablet by mouth 2 (two) times daily. 09/14/16   [provider]  divalproex (DEPAKOTE) 250 MG DR tablet Take 250 mg by mouth 2 (two) times daily.    [provider]  empagliflozin (JARDIANCE) 10 MG TABS tablet Take 10 mg by mouth daily.    [provider]  Lactobacillus Probiotic TABS Take 1 capsule by mouth daily.    [provider]  lisinopril (PRINIVIL,ZESTRIL) 5 MG tablet Take 25 mg by mouth daily.  09/14/16  [provider]  metFORMIN (GLUCOPHAGE) 500 MG tablet Take 500 mg by mouth at bedtime. 08/15/16   [provider]  omeprazole (PRILOSEC) 20 MG capsule Take 20 mg by mouth daily. 09/14/16   [provider]  rosuvastatin (CRESTOR) 10 MG tablet Take 10 mg by mouth daily.    [provider]  sertraline (ZOLOFT) 100 MG tablet Take 150 mg by mouth daily. 09/14/16   [provider]  vitamin A 8000 UNIT capsule Take 8,000 Units by mouth at bedtime.    [provider]  vitamin E 400 UNIT capsule Take 400 Units by mouth at bedtime.    [provider]  Zinc 30 MG TABS Take 30 mg by mouth at bedtime.    [provider]     Allergies Patient has no known allergies.  Family History  Problem Relation Age of Onset  . Breast cancer Neg Hx     Social History Social History   Tobacco Use  . Smoking status: Never Smoker  . Smokeless tobacco: Never Used  Substance Use Topics  . Alcohol use: No  . Drug use: No    Review of Systems  Constitutional: No fever/chills Eyes: No visual changes.  ENT: No neck pain Cardiovascular: Denies chest pain. Respiratory: Denies shortness of breath. Gastrointestinal: No abdominal pain.   Genitourinary: Negative for dysuria. Musculoskeletal: As above Skin: Negative for rash. Neurological: Negative for headaches   ____________________________________________   PHYSICAL EXAM:  VITAL SIGNS: ED Triage Vitals [06/07/19 0939]  Enc Vitals Group     BP (!) 156/61     Pulse Rate 74     Resp 20     Temp 98.3 F (36.8 C)     Temp Source Oral     SpO2 98 %     Weight 95.3 kg (210 lb)     Height 1.626 m (5\' 4" )     Head Circumference      Peak Flow      Pain Score 0     Pain Loc      Pain Edu?      Excl. in [place]?     Constitutional: Alert. No acute distress.  Eyes: Conjunctivae are normal.  PERRLA, EOMI Head: Atraumatic. Nose: No congestion/rhinnorhea. Mouth/Throat: Mucous membranes are moist.   Neck:  Painless ROM Cardiovascular: Normal rate, regular rhythm. Grossly normal heart sounds.  Good peripheral circulation. Respiratory: Normal respiratory effort.  No retractions. Lungs CTAB. Gastrointestinal: Soft and nontender. No distention.    Musculoskeletal: Full range of motion of all extremities, extremities are warm and well perfused.  Mild erythema to the dorsum of the left midfoot with some mild tenderness there, no skin break Neurologic:  Normal speech and language. No gross focal neurologic deficits are appreciated.  Skin:  Skin is warm, dry, see above  Psychiatric: Mood and affect are normal. Speech and behavior are normal.  ____________________________________________   LABS (all labs ordered are listed, but only abnormal results are displayed)  Labs Reviewed  CBC WITH DIFFERENTIAL/PLATELET - Abnormal; Notable for the following components:      Result Value   WBC 11.3 (*)    RDW 15.8 (*)    Platelets 143 (*)    Neutro Abs 8.9 (*)    All other components within normal limits  COMPREHENSIVE METABOLIC PANEL - Abnormal; Notable for the following components:   CO2 20 (*)    Glucose, Bld 163 (*)    BUN 24 (*)    All  other components within normal limits  URINALYSIS, COMPLETE (UACMP) WITH MICROSCOPIC - Abnormal; Notable for the following components:   Color, Urine YELLOW (*)    APPearance CLEAR (*)    All other components within normal limits  URINE CULTURE  SARS CORONAVIRUS 2 (TAT 6-24 HRS)   ____________________________________________  EKG ED ECG REPORT I, Lavonia Drafts, the attending physician, personally viewed and interpreted this ECG.  Date: 06/07/2019  Rhythm: normal sinus rhythm QRS Axis: normal Intervals: normal ST/T Wave abnormalities: normal    ____________________________________________  RADIOLOGY  Chest x-ray unremarkable ____________________________________________   PROCEDURES  Procedure(s) performed: No  Procedures   Critical Care performed: No ____________________________________________   INITIAL IMPRESSION / ASSESSMENT AND PLAN / ED COURSE  Pertinent labs & imaging results that were available during my care of the patient were reviewed by me and considered in my medical decision making (see chart for details).  Patient presents with complaint of left foot pain, possibly some weakness.  Patient is somewhat of a poor historian, she reports her husband does not drive but was there this morning, we will call him to see if we get more history.  Son contacted who notes that patient has  been unable to ambulate because of the pain in her left foot, she has been unable to get to the bathroom.  Her husband is unable to help her because of physical difficulties himself.  Son reports the patient does have a history of some mild confusion  Patient with mild erythema to the left foot suspicious for gout given significant pain.  Well perfused, no traumatic injury noted.  Will obtain x-ray  Treated with p.o. Vicodin, IV Toradol, still unable to bear weight on the left foot because of pain.  Discussed with son at length, he was unable to care for her at home, have contacted the hospitalist for admission for pain control, further evaluation _____________________   FINAL CLINICAL IMPRESSION(S) / ED DIAGNOSES  Final diagnoses:  Acute gout of left foot, unspecified cause        Note:  This document was prepared using Dragon voice recognition software and may include unintentional dictation errors.   Lavonia Drafts, MD 06/07/19 [phone]

## 2019-06-07 NOTE — ED Triage Notes (Signed)
Pt via EMS from home. Pt called EMS for weakness. Per EMS, pt is mildly confused but pt is A&Ox4. Per EMS, pt has a odor to her urine. Hx of gout in the L ankle and foot, swelling noted to left ankle and foot.  EMS Vitals: 98.2, 181 CBG, 120/60, 80 pulse, 95% RA, 30 RR

## 2019-06-07 NOTE — ED Notes (Signed)
Attempted to ambulate pt. Assisted pt to edge of bed, then to a standing position. While standing pt would not put any weight on her L foot. After multiple attempts to convince pt to try weight on her L foot she continued to refuse due to pain and fear of pain. RN notified.

## 2019-06-08 ENCOUNTER — Observation Stay: Payer: Medicare Other

## 2019-06-08 DIAGNOSIS — M1990 Unspecified osteoarthritis, unspecified site: Secondary | ICD-10-CM | POA: Diagnosis present

## 2019-06-08 DIAGNOSIS — K219 Gastro-esophageal reflux disease without esophagitis: Secondary | ICD-10-CM | POA: Diagnosis present

## 2019-06-08 DIAGNOSIS — M109 Gout, unspecified: Secondary | ICD-10-CM | POA: Diagnosis present

## 2019-06-08 DIAGNOSIS — N179 Acute kidney failure, unspecified: Secondary | ICD-10-CM | POA: Diagnosis not present

## 2019-06-08 DIAGNOSIS — R197 Diarrhea, unspecified: Secondary | ICD-10-CM | POA: Diagnosis present

## 2019-06-08 DIAGNOSIS — E1151 Type 2 diabetes mellitus with diabetic peripheral angiopathy without gangrene: Secondary | ICD-10-CM | POA: Diagnosis present

## 2019-06-08 DIAGNOSIS — Z9012 Acquired absence of left breast and nipple: Secondary | ICD-10-CM | POA: Diagnosis not present

## 2019-06-08 DIAGNOSIS — Z888 Allergy status to other drugs, medicaments and biological substances status: Secondary | ICD-10-CM | POA: Diagnosis not present

## 2019-06-08 DIAGNOSIS — F39 Unspecified mood [affective] disorder: Secondary | ICD-10-CM | POA: Diagnosis present

## 2019-06-08 DIAGNOSIS — Z20828 Contact with and (suspected) exposure to other viral communicable diseases: Secondary | ICD-10-CM | POA: Diagnosis present

## 2019-06-08 DIAGNOSIS — Z9221 Personal history of antineoplastic chemotherapy: Secondary | ICD-10-CM | POA: Diagnosis not present

## 2019-06-08 DIAGNOSIS — G9341 Metabolic encephalopathy: Secondary | ICD-10-CM | POA: Diagnosis present

## 2019-06-08 DIAGNOSIS — R270 Ataxia, unspecified: Secondary | ICD-10-CM

## 2019-06-08 DIAGNOSIS — E119 Type 2 diabetes mellitus without complications: Secondary | ICD-10-CM | POA: Diagnosis not present

## 2019-06-08 DIAGNOSIS — Z853 Personal history of malignant neoplasm of breast: Secondary | ICD-10-CM | POA: Diagnosis not present

## 2019-06-08 DIAGNOSIS — G9340 Encephalopathy, unspecified: Secondary | ICD-10-CM | POA: Diagnosis not present

## 2019-06-08 DIAGNOSIS — I10 Essential (primary) hypertension: Secondary | ICD-10-CM | POA: Diagnosis present

## 2019-06-08 DIAGNOSIS — E785 Hyperlipidemia, unspecified: Secondary | ICD-10-CM | POA: Diagnosis present

## 2019-06-08 DIAGNOSIS — Z8541 Personal history of malignant neoplasm of cervix uteri: Secondary | ICD-10-CM | POA: Diagnosis not present

## 2019-06-08 DIAGNOSIS — E860 Dehydration: Secondary | ICD-10-CM | POA: Diagnosis present

## 2019-06-08 DIAGNOSIS — Z7984 Long term (current) use of oral hypoglycemic drugs: Secondary | ICD-10-CM | POA: Diagnosis not present

## 2019-06-08 LAB — CBC WITH DIFFERENTIAL/PLATELET
Abs Immature Granulocytes: 0.06 10*3/uL (ref 0.00–0.07)
Basophils Absolute: 0 10*3/uL (ref 0.0–0.1)
Basophils Relative: 0 %
Eosinophils Absolute: 0.2 10*3/uL (ref 0.0–0.5)
Eosinophils Relative: 2 %
HCT: 36.8 % (ref 36.0–46.0)
Hemoglobin: 11.6 g/dL — ABNORMAL LOW (ref 12.0–15.0)
Immature Granulocytes: 1 %
Lymphocytes Relative: 13 %
Lymphs Abs: 1.3 10*3/uL (ref 0.7–4.0)
MCH: 26.5 pg (ref 26.0–34.0)
MCHC: 31.5 g/dL (ref 30.0–36.0)
MCV: 84 fL (ref 80.0–100.0)
Monocytes Absolute: 0.8 10*3/uL (ref 0.1–1.0)
Monocytes Relative: 8 %
Neutro Abs: 7.5 10*3/uL (ref 1.7–7.7)
Neutrophils Relative %: 76 %
Platelets: 168 10*3/uL (ref 150–400)
RBC: 4.38 MIL/uL (ref 3.87–5.11)
RDW: 15.6 % — ABNORMAL HIGH (ref 11.5–15.5)
WBC: 9.8 10*3/uL (ref 4.0–10.5)
nRBC: 0 % (ref 0.0–0.2)

## 2019-06-08 LAB — COMPREHENSIVE METABOLIC PANEL
ALT: 34 U/L (ref 0–44)
AST: 74 U/L — ABNORMAL HIGH (ref 15–41)
Albumin: 3.6 g/dL (ref 3.5–5.0)
Alkaline Phosphatase: 91 U/L (ref 38–126)
Anion gap: 12 (ref 5–15)
BUN: 46 mg/dL — ABNORMAL HIGH (ref 8–23)
CO2: 21 mmol/L — ABNORMAL LOW (ref 22–32)
Calcium: 9.2 mg/dL (ref 8.9–10.3)
Chloride: 104 mmol/L (ref 98–111)
Creatinine, Ser: 1.31 mg/dL — ABNORMAL HIGH (ref 0.44–1.00)
GFR calc Af Amer: 45 mL/min — ABNORMAL LOW (ref >60)
GFR calc non Af Amer: 39 mL/min — ABNORMAL LOW (ref >60)
Glucose, Bld: 139 mg/dL — ABNORMAL HIGH (ref 70–99)
Potassium: 4.3 mmol/L (ref 3.5–5.1)
Sodium: 137 mmol/L (ref 135–145)
Total Bilirubin: 0.8 mg/dL (ref 0.3–1.2)
Total Protein: 7.4 g/dL (ref 6.5–8.1)

## 2019-06-08 LAB — GLUCOSE, CAPILLARY
Glucose-Capillary: 138 mg/dL — ABNORMAL HIGH (ref 70–99)
Glucose-Capillary: 148 mg/dL — ABNORMAL HIGH (ref 70–99)
Glucose-Capillary: 130 mg/dL — ABNORMAL HIGH (ref 70–99)

## 2019-06-08 LAB — URINE CULTURE: Culture: NO GROWTH

## 2019-06-08 LAB — SARS CORONAVIRUS 2 (TAT 6-24 HRS): SARS Coronavirus 2: NEGATIVE

## 2019-06-08 NOTE — Progress Notes (Signed)
[facility] Progress Note  Patient: Gabriela Pham [id number]   PCP: Toni Arthurs, NP DOB: 03-Jun-1941   DOA: 06/07/2019   DOS: 06/08/2019   Date of Service: the patient was seen and examined on 06/08/2019  Chief Complaint  Patient presents with  . Weakness   Brief hospital course: Natoyia Cashdollar Altman is a 78 y.o. female with Past medical history of breast cancer, cervical cancer, cholelithiasis, depression, type II DM, GERD, gout. Patient presents with complaints of injury and left ankle pain.  Currently further plan is identify safe discharge plan for the pt.  Subjective: reports dizziness and ankle pain, also has warmth and swelling of the ankle. No chest pain, no focal deficit. Pt has been pleasantly confused  Assessment and Plan: Scheduled Meds: . allopurinol  100 mg Oral Daily  . [START ON 06/09/2019] colchicine  0.6 mg Oral BID  . dicyclomine  10 mg Oral TID AC  . divalproex  250 mg Oral BID  . enoxaparin (LOVENOX) injection  40 mg Subcutaneous Q24H  . insulin aspart  0-5 Units Subcutaneous QHS  . insulin aspart  0-9 Units Subcutaneous TID WC  . lisinopril  25 mg Oral Daily  . pantoprazole  40 mg Oral Daily  . rosuvastatin  10 mg Oral Daily  . sertraline  150 mg Oral Daily   Continuous Infusions: PRN Meds: acetaminophen **OR** acetaminophen, diphenoxylate-atropine, HYDROcodone-acetaminophen, ondansetron **OR** ondansetron (ZOFRAN) IV, senna-docusate  1.  Acute gout. Pain control Presents with complaints of left ankle pain. Appears to have acute gout. We will treat with colchicine. Continue allopurinol. Pain controlled with Norco. PT consulted.  2.  Type 2 diabetes mellitus, controlled.  No complication. Holding oral hypoglycemic agent and placing the patient on sliding scale insulin. hemoglobin A1c 6.5  3.  Mood disorder. Continue Depakote, Zoloft.  4.  Essential hypertension. Holding lisinopril for now.  5.  Chronic diarrhea.  Patient is on scheduled Lomotil and Bentyl. Continue the same for now.  6. Dizziness Likely multifactorial Will give fluids overnight CT head negative  7. Physical deconditioning  pt presened with gout, is unable to perform transfers despite max Assist /total A from PT x 6 attempts.  Patient refuse to put on LLE on ground due to pain.  Pt is significantly unsteady and is unable to ambulate or transfer without multiple assist.  Lives with an elderly husband who is unable to support the pt and family is not available 24/7. She could not maintain sitting EOB and if PT was not holding her, pt would have fallen off the bed 3 times. At present her discharge to her current environment is not safe.  We will have social worker consult and arrange SNF per PT recommendation.   Diet: Cardiac diet  DVT Prophylaxis: Subcutaneous Heparin    Advance goals of care discussion: Full code  Family Communication: no family was present at bedside, at the time of interview.   Disposition:  Discharge to SNF.  Consultants: none Procedures: none  Antibiotics: Anti-infectives (From admission, onward)   None       Objective: Physical Exam: Vitals:   06/07/19 1819 06/07/19 1954 06/08/19 0818 06/08/19 1628  BP: 123/67 124/67 115/89 94/75  Pulse: 77 86 81 86  Resp: 18 18 16 18   Temp: 98.2 F (36.8 C) 98 F (36.7 C)  98.7 F (37.1 C)  TempSrc: Oral Oral  Oral  SpO2: 94% 97% 95% 94%  Weight:      Height:  No intake or output data in the 24 hours ending 06/08/19 1633 Filed Weights   06/07/19 0939  Weight: 95.3 kg   General: alert and oriented to time, place, and person. Appear in mild distress, affect appropriate Eyes: PERRL, Conjunctiva normal ENT: Oral Mucosa Clear, moist  Neck: no JVD, no Abnormal Mass Or lumps Cardiovascular: S1 and S2 Present, no Murmur,  Respiratory: good respiratory effort, Bilateral Air entry equal and Decreased, no signs of accessory muscle use, Clear to  Auscultation, no Crackles, no wheezes Abdomen: Bowel Sound present, Soft and no tenderness, no hernia Skin: left leg redness and warmth  Extremities: left ankle, no other Pedal edema, no calf tenderness Neurologic: without any new focal findings Gait not checked due to patient safety concerns  Data Reviewed: I have personally reviewed and interpreted daily labs, tele strips, imagings as discussed above. I reviewed all nursing notes, pharmacy notes, vitals, pertinent old records I have discussed plan of care as described above with RN and patient/family.  CBC: Recent Labs  Lab 06/07/19 0948  WBC 11.3*  NEUTROABS 8.9*  HGB 12.3  HCT 38.2  MCV 81.1  PLT [id number]*   Basic Metabolic Panel: Recent Labs  Lab 06/07/19 0948  NA 136  K 4.1  CL 104  CO2 20*  GLUCOSE 163*  BUN 24*  CREATININE 0.91  CALCIUM 9.1    Liver Function Tests: Recent Labs  Lab 06/07/19 0948  AST 22  ALT 14  ALKPHOS 61  BILITOT 1.0  PROT 8.0  ALBUMIN 3.8   No results for input(s): LIPASE, AMYLASE in the last 168 hours. No results for input(s): AMMONIA in the last 168 hours. Coagulation Profile: No results for input(s): INR, PROTIME in the last 168 hours. Cardiac Enzymes: No results for input(s): CKTOTAL, CKMB, CKMBINDEX, TROPONINI in the last 168 hours. BNP (last 3 results) No results for input(s): PROBNP in the last 8760 hours. CBG: Recent Labs  Lab 06/07/19 1757 06/07/19 1825 06/07/19 2149 06/08/19 0837 06/08/19 1203  GLUCAP 108* 108* 131* 138* 148*   Studies: Ct Head Wo Contrast  Result Date: 06/08/2019 CLINICAL DATA:  78 year old female with dizziness and ataxia EXAM: CT HEAD WITHOUT CONTRAST TECHNIQUE: Contiguous axial images were obtained from the base of the skull through the vertex without intravenous contrast. COMPARISON:  None. FINDINGS: Brain: No evidence of acute infarction, hemorrhage, hydrocephalus, extra-axial collection or mass lesion/mass effect. Cerebral cortical volume  loss commensurate with age. Vascular: No hyperdense vessel or unexpected calcification. Skull: Normal. Negative for fracture or focal lesion. Sinuses/Orbits: No acute finding. Other: None. IMPRESSION: 1. No acute intracranial abnormality. 2. 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 Electronically Signed   By: Jacqulynn Cadet M.D.   On: 06/08/2019 14:56    Time spent: 35 minutes  Author: Berle Mull, MD [facility] 06/08/2019 4:33 PM  To reach On-call, see care teams to locate the attending and reach out to them via [email or web address]. If 7PM-7AM, please contact night-coverage If you still have difficulty reaching the attending provider, please page the [facility] (Director on Call) for [facility] on amion for assistance.

## 2019-06-08 NOTE — Evaluation (Signed)
Physical Therapy Evaluation Patient Details Name: Gabriela Pham MRN: [id number] DOB: 06/09/1941 Today's Date: 06/08/2019   History of Present Illness  Patient is a pleasantly confused 78 year old female who presents to the ED for gout. PMH includes R reverse total shoulder arthroplasty, breast cancer, cervical cancer, cholelithiasis, depression, Type II DM, GERD, and HTN. Patient is a poor historian and limited in history giving.    Clinical Impression  Patient is a pleasantly confused 78 year old who presents with diffuse weakness and limited mobility. Patient is a poor historian and unable to provide answers on history and PLOF due to conflicting answers. Patient required heavy assistance for transition supine to sitting EOB with very poor safety awareness once sitting EOB. Patient requires Min A for sitting balance with frequent posterior LOB requiring PT to hold patient due to patient being unaware that she is falling. Patient unable to attempt standing despite total A for multiple trials with use of RW. She requires frequent task orientation due to limited attention span to task. Due to patient's high fall risk and limited mobility she would benefit from SNF placement at this time. She will benefit from skilled physical therapy to increase strength, mobility, and stability for decreased falls risk.     Follow Up Recommendations SNF    Equipment Recommendations  (provided by SNF)    Recommendations for Other Services Other (comment)(cognitive eval)     Precautions / Restrictions        Mobility  Bed Mobility Overal bed mobility: Needs Assistance Bed Mobility: Supine to Sit     Supine to sit: HOB elevated;Mod assist     General bed mobility comments: Mod A with max cueing for sequencing and safety. Poor safety awareness.  Transfers Overall transfer level: Needs assistance Equipment used: Rolling walker (2 wheeled) Transfers: Sit to/from Stand Sit to Stand: From  elevated surface         General transfer comment: Patient is unable to perform despite max A/total A from PT x 6 attempts. Patient refuse to put on LLE on ground due to pain.  Ambulation/Gait             General Gait Details: unable to attempt  Stairs            Wheelchair Mobility    Modified Rankin (Stroke Patients Only)       Balance Overall balance assessment: Needs assistance Sitting-balance support: Bilateral upper extremity supported Sitting balance-Leahy Scale: Poor Sitting balance - Comments: frequent Posterior LOB, requires Min A to maintain seated EOB Postural control: Posterior lean Standing balance support: Bilateral upper extremity supported Standing balance-Leahy Scale: Zero Standing balance comment: unable to perform standing                             Pertinent Vitals/Pain Pain Assessment: Faces Faces Pain Scale: Hurts whole lot Pain Location: L foot upon attempting to weightbear Pain Descriptors / Indicators: Throbbing Pain Intervention(s): Limited activity within patient's tolerance;Repositioned;Other (comment)(RN notified)    Home Living Family/patient expects to be discharged to:: Private residence Living Arrangements: Spouse/significant other Available Help at Discharge: (patient reports she lives with husband,) Type of Home: House Home Access: Other (comment)(Patient unable to answer, states yes then no.)     Home Layout: Other (Comment)(Patient confused and states she lives in a house for over 30 years but then contradicts self and says she just moved.)   Additional Comments: Patient is a poor historian  and reports conflicting answers. States she uses a walker at home and lives with her husband. Upon further questioning she reports different answers.    Prior Function           Comments: Patient reports she walks with a walker prior to her admission.     Hand Dominance   Dominant Hand: Right     Extremity/Trunk Assessment   Upper Extremity Assessment Upper Extremity Assessment: Generalized weakness    Lower Extremity Assessment Lower Extremity Assessment: Generalized weakness(patient unable to tolerate sitting EOB without Min A to stabilize self. Hips grossly 3+/5, knees 4-/5 L foot unable to be tested due to pain)       Communication   Communication: No difficulties;Other (comment)(confused)  Cognition Arousal/Alertness: Awake/alert Behavior During Therapy: WFL for tasks assessed/performed;Anxious(confused,) Overall Cognitive Status: No family/caregiver present to determine baseline cognitive functioning                                 General Comments: Patient is confused and contradicts herself frequently. Oriented to basic information but unable to answer further questions. Became upset upon asking if she had help at home and stated her husband gets upset saying she doesn't try.      General Comments General comments (skin integrity, edema, etc.): slight edema of bilateral ankles noted. L foot edema    Exercises Other Exercises Other Exercises: patient educated on safe bed mobility and transitions, poor safety awareness. Other Exercises: sitting EOB with Min A, three near LOB posterior requiring PT to hold patient upright, patient unaware of losing balance.   Assessment/Plan    PT Assessment Patient needs continued PT services  PT Problem List Decreased strength;Decreased activity tolerance;Decreased balance;Decreased mobility;Decreased safety awareness;Pain       PT Treatment Interventions Balance training;DME instruction;Gait training;Functional mobility training;Therapeutic activities;Stair training;Therapeutic exercise;Neuromuscular re-education;Cognitive remediation;Patient/family education;Manual techniques;Wheelchair mobility training    PT Goals (Current goals can be found in the Care Plan section)  Acute Rehab PT Goals Patient Stated Goal:  to walk again PT Goal Formulation: With patient Time For Goal Achievement: 06/22/19 Potential to Achieve Goals: Fair    Frequency Min 2X/week   Barriers to discharge Decreased caregiver support Patient will require assistance    Co-evaluation               AM-PAC PT "6 Clicks" Mobility  Outcome Measure Help needed turning from your back to your side while in a flat bed without using bedrails?: A Lot Help needed moving from lying on your back to sitting on the side of a flat bed without using bedrails?: A Lot Help needed moving to and from a bed to a chair (including a wheelchair)?: Total Help needed standing up from a chair using your arms (e.g., wheelchair or bedside chair)?: Total Help needed to walk in hospital room?: Total Help needed climbing 3-5 steps with a railing? : Total 6 Click Score: 8    End of Session Equipment Utilized During Treatment: Gait belt Activity Tolerance: Patient limited by fatigue;Patient limited by pain Patient left: in bed;with call bell/phone within reach;with nursing/sitter in room Nurse Communication: Mobility status PT Visit Diagnosis: Unsteadiness on feet (R26.81);Other abnormalities of gait and mobility (R26.89);Repeated falls (R29.6);Muscle weakness (generalized) (M62.81);Pain Pain - Right/Left: Left Pain - part of body: Ankle and joints of foot    Time: 1351-1416 PT Time Calculation (min) (ACUTE ONLY): 25 min   Charges:   PT Evaluation $PT  Eval Moderate Complexity: 1 Mod PT Treatments $Therapeutic Activity: 8-22 mins        Janna Arch, PT, DPT    Janna Arch 06/08/2019, 2:50 PM

## 2019-06-08 NOTE — ED Notes (Signed)
Pt given meal tray.

## 2019-06-09 ENCOUNTER — Inpatient Hospital Stay: Payer: Medicare Other

## 2019-06-09 LAB — T4, FREE: Free T4: 0.8 ng/dL (ref 0.61–1.12)

## 2019-06-09 LAB — COMPREHENSIVE METABOLIC PANEL
ALT: 50 U/L — ABNORMAL HIGH (ref 0–44)
AST: 90 U/L — ABNORMAL HIGH (ref 15–41)
Albumin: 3.2 g/dL — ABNORMAL LOW (ref 3.5–5.0)
Alkaline Phosphatase: 100 U/L (ref 38–126)
Anion gap: 12 (ref 5–15)
BUN: 45 mg/dL — ABNORMAL HIGH (ref 8–23)
CO2: 20 mmol/L — ABNORMAL LOW (ref 22–32)
Calcium: 9.2 mg/dL (ref 8.9–10.3)
Chloride: 106 mmol/L (ref 98–111)
Creatinine, Ser: 1.04 mg/dL — ABNORMAL HIGH (ref 0.44–1.00)
GFR calc Af Amer: 60 mL/min — ABNORMAL LOW (ref >60)
GFR calc non Af Amer: 51 mL/min — ABNORMAL LOW (ref >60)
Glucose, Bld: 177 mg/dL — ABNORMAL HIGH (ref 70–99)
Potassium: 4.3 mmol/L (ref 3.5–5.1)
Sodium: 138 mmol/L (ref 135–145)
Total Bilirubin: 0.5 mg/dL (ref 0.3–1.2)
Total Protein: 7.2 g/dL (ref 6.5–8.1)

## 2019-06-09 LAB — AMMONIA: Ammonia: 49 umol/L — ABNORMAL HIGH (ref 9–35)

## 2019-06-09 LAB — TSH: TSH: 2.565 u[IU]/mL (ref 0.350–4.500)

## 2019-06-09 LAB — VITAMIN B12: Vitamin B-12: 740 pg/mL (ref 180–914)

## 2019-06-09 LAB — VALPROIC ACID LEVEL: Valproic Acid Lvl: 44 ug/mL — ABNORMAL LOW (ref 50.0–100.0)

## 2019-06-09 LAB — HIV ANTIBODY (ROUTINE TESTING W REFLEX): HIV Screen 4th Generation wRfx: NONREACTIVE

## 2019-06-09 LAB — GLUCOSE, CAPILLARY
Glucose-Capillary: 158 mg/dL — ABNORMAL HIGH (ref 70–99)
Glucose-Capillary: 138 mg/dL — ABNORMAL HIGH (ref 70–99)
Glucose-Capillary: 105 mg/dL — ABNORMAL HIGH (ref 70–99)
Glucose-Capillary: 102 mg/dL — ABNORMAL HIGH (ref 70–99)

## 2019-06-09 MED ORDER — SODIUM CHLORIDE 0.9 % IV BOLUS
1000.0000 mL | Freq: Once | INTRAVENOUS | Status: AC
  Administered 2019-06-09: 1000 mL via INTRAVENOUS

## 2019-06-09 MED ORDER — LACTULOSE 10 GM/15ML PO SOLN
20.0000 g | Freq: Every day | ORAL | Status: DC
  Administered 2019-06-09 – 2019-06-11 (×3): 20 g via ORAL
  Filled 2019-06-09 (×3): qty 30

## 2019-06-09 MED ORDER — SODIUM CHLORIDE 0.9 % IV SOLN
INTRAVENOUS | Status: DC
  Administered 2019-06-09 – 2019-06-11 (×5): via INTRAVENOUS

## 2019-06-09 NOTE — Progress Notes (Signed)
[facility] Progress Note  Patient: Gabriela Pham [id number]   PCP: Toni Arthurs, NP DOB: 1941/01/19   DOA: 06/07/2019   DOS: 06/09/2019   Date of Service: the patient was seen and examined on 06/09/2019  Chief Complaint  Patient presents with  . Weakness   Brief hospital course: Dreonna Praytor Trieskeyis a 78 y.o.femalewith Past medical history ofbreast cancer, cervical cancer, cholelithiasis, depression, type II DM, GERD, gout. Patient presents with complaints of injury and left ankle pain.  Currently further plan is identify safe discharge plan for the pt.  Subjective: Patient continues to have left leg pain.  Patient is not able to tell me where she is right now.  She does not remember the name of the city.  She knows her name she knows her date of birth.  She does not remember why she is in the hospital.  Denies any chest pain nausea vomiting or abdominal pain.  Assessment and Plan: Scheduled Meds: . allopurinol  100 mg Oral Daily  . colchicine  0.6 mg Oral BID  . dicyclomine  10 mg Oral TID AC  . divalproex  250 mg Oral BID  . enoxaparin (LOVENOX) injection  40 mg Subcutaneous Q24H  . insulin aspart  0-5 Units Subcutaneous QHS  . insulin aspart  0-9 Units Subcutaneous TID WC  . lactulose  20 g Oral Daily  . lisinopril  25 mg Oral Daily  . pantoprazole  40 mg Oral Daily  . rosuvastatin  10 mg Oral Daily  . sertraline  150 mg Oral Daily   Continuous Infusions: . sodium chloride 100 mL/hr at 06/09/19 1459   PRN Meds: acetaminophen **OR** acetaminophen, diphenoxylate-atropine, HYDROcodone-acetaminophen, ondansetron **OR** ondansetron (ZOFRAN) IV, senna-docusate  1.Acute gout. Pain control Presents with complaints of left ankle pain. Appears to have acute gout. We will treat with colchicine. Continue allopurinol. Pain controlled with Norco. PT consulted.  2.Type 2 diabetes mellitus, controlled. No complication. Holding oral  hypoglycemic agent and placing the patient on sliding scale insulin. hemoglobin A1c 6.5  3.Mood disorder. Continue Depakote, Zoloft.  Depakote level is slightly lower.  Ammonia level mildly high.  Will provide lactulose.  4.Essential hypertension. Holding lisinopril for now.  5.Chronic diarrhea. Patient is on scheduled Lomotil and Bentyl. Continue the same for now. 1 dose of lactulose.  6. Dizziness Acute encephalopathy likely metabolic. Likely multifactorial Will give fluids  CT head negatve Get MRI brain. Family reports that the patient actually has slowly progressive memory issues more pronounced in the last 2 weeks ago.  7. Physical deconditioning  pt presened with gout, is unable to perform transfers despite max Assist /total A from PT x 6 attempts.  Patient refuse to put on LLE on ground due to pain.  Pt is significantly unsteady and is unable to ambulate or transfer without multiple assist.  Lives with an elderly husband who is unable to support the pt and family is not available 24/7. She could not maintain sitting EOB and if PT was not holding her, pt would have fallen off the bed 3 times. At present her discharge to her current environment is not safe.  We will have social worker consult and arrange SNF per PT recommendation.   Diet: Cardiac diet  DVT Prophylaxis: Subcutaneous Heparin    Advance goals of care discussion: Full code  Family Communication: no family was present at bedside, at the time of interview.  Discussed with family on the phone, the pt provided permission to discuss medical plan with the  family. Opportunity was given to ask question and all questions were answered satisfactorily.   Disposition:  Discharge to SNF.  Consultants: None Procedures: None  Antibiotics: Anti-infectives (From admission, onward)   None       Objective: Physical Exam: Vitals:   06/08/19 1628 06/08/19 2341 06/09/19 0926 06/09/19 1136  BP: 94/75 (!)  137/98 122/73 114/60  Pulse: 86 93 74 78  Resp: 18 16 18 17   Temp: 98.7 F (37.1 C) 98.6 F (37 C) 98.1 F (36.7 C)   TempSrc: Oral Oral Oral   SpO2: 94% 91% 94% 93%  Weight:      Height:        Intake/Output Summary (Last 24 hours) at 06/09/2019 1606 Last data filed at 06/09/2019 1300 Gross per 24 hour  Intake 240 ml  Output 1800 ml  Net -1560 ml   Filed Weights   06/07/19 0939  Weight: 95.3 kg   General: alert and oriented only to person. Appear in mild distress, affect anxious Eyes: PERRL, Conjunctiva normal ENT: Oral Mucosa Clear, moist  Neck: no JVD, no Abnormal Mass Or lumps Cardiovascular: S1 and S2 Present, no Murmur,  Respiratory: good respiratory effort, Bilateral Air entry equal and Decreased, no signs of accessory muscle use, Clear to Auscultation, no Crackles, no wheezes Abdomen: Bowel Sound present, Soft and no tenderness, no hernia Skin: no rashes  Extremities: no Pedal edema, no calf tenderness Neurologic: without any new focal findings Gait not checked due to patient safety concerns  Data Reviewed: I have personally reviewed and interpreted daily labs, tele strips, imagings as discussed above. I reviewed all nursing notes, pharmacy notes, vitals, pertinent old records I have discussed plan of care as described above with RN and patient/family.  CBC: Recent Labs  Lab 06/07/19 0948 06/08/19 1640  WBC 11.3* 9.8  NEUTROABS 8.9* 7.5  HGB 12.3 11.6*  HCT 38.2 36.8  MCV 81.1 84.0  PLT 143* [id number]   Basic Metabolic Panel: Recent Labs  Lab 06/07/19 0948 06/08/19 1640 06/09/19 1052  NA 136 137 138  K 4.1 4.3 4.3  CL 104 104 106  CO2 20* 21* 20*  GLUCOSE 163* 139* 177*  BUN 24* 46* 45*  CREATININE 0.91 1.31* 1.04*  CALCIUM 9.1 9.2 9.2    Liver Function Tests: Recent Labs  Lab 06/07/19 0948 06/08/19 1640 06/09/19 1052  AST 22 74* 90*  ALT 14 34 50*  ALKPHOS 61 91 100  BILITOT 1.0 0.8 0.5  PROT 8.0 7.4 7.2  ALBUMIN 3.8 3.6 3.2*   No  results for input(s): LIPASE, AMYLASE in the last 168 hours. Recent Labs  Lab 06/09/19 1052  AMMONIA 49*   Coagulation Profile: No results for input(s): INR, PROTIME in the last 168 hours. Cardiac Enzymes: No results for input(s): CKTOTAL, CKMB, CKMBINDEX, TROPONINI in the last 168 hours. BNP (last 3 results) No results for input(s): PROBNP in the last 8760 hours. CBG: Recent Labs  Lab 06/08/19 0837 06/08/19 1203 06/08/19 1649 06/09/19 0744 06/09/19 1124  GLUCAP 138* 148* 130* 158* 138*   Studies: No results found.   Time spent: 35 minutes  Author: Berle Mull, MD [facility] 06/09/2019 4:06 PM  To reach On-call, see care teams to locate the attending and reach out to them via [email or web address]. If 7PM-7AM, please contact night-coverage If you still have difficulty reaching the attending provider, please page the [facility] (Director on Call) for [facility] on amion for assistance.

## 2019-06-09 NOTE — Plan of Care (Signed)

## 2019-06-10 DIAGNOSIS — R197 Diarrhea, unspecified: Secondary | ICD-10-CM | POA: Diagnosis present

## 2019-06-10 DIAGNOSIS — N179 Acute kidney failure, unspecified: Secondary | ICD-10-CM | POA: Diagnosis not present

## 2019-06-10 DIAGNOSIS — I10 Essential (primary) hypertension: Secondary | ICD-10-CM | POA: Diagnosis present

## 2019-06-10 DIAGNOSIS — E119 Type 2 diabetes mellitus without complications: Secondary | ICD-10-CM

## 2019-06-10 DIAGNOSIS — G9340 Encephalopathy, unspecified: Secondary | ICD-10-CM | POA: Diagnosis present

## 2019-06-10 LAB — GLUCOSE, CAPILLARY
Glucose-Capillary: 132 mg/dL — ABNORMAL HIGH (ref 70–99)
Glucose-Capillary: 111 mg/dL — ABNORMAL HIGH (ref 70–99)
Glucose-Capillary: 137 mg/dL — ABNORMAL HIGH (ref 70–99)
Glucose-Capillary: 106 mg/dL — ABNORMAL HIGH (ref 70–99)

## 2019-06-10 LAB — CBC
HCT: 32 % — ABNORMAL LOW (ref 36.0–46.0)
Hemoglobin: 10.4 g/dL — ABNORMAL LOW (ref 12.0–15.0)
MCH: 26.8 pg (ref 26.0–34.0)
MCHC: 32.5 g/dL (ref 30.0–36.0)
MCV: 82.5 fL (ref 80.0–100.0)
Platelets: 131 10*3/uL — ABNORMAL LOW (ref 150–400)
RBC: 3.88 MIL/uL (ref 3.87–5.11)
RDW: 15.6 % — ABNORMAL HIGH (ref 11.5–15.5)
WBC: 6.9 10*3/uL (ref 4.0–10.5)
nRBC: 0 % (ref 0.0–0.2)

## 2019-06-10 LAB — BASIC METABOLIC PANEL
Anion gap: 10 (ref 5–15)
BUN: 38 mg/dL — ABNORMAL HIGH (ref 8–23)
CO2: 20 mmol/L — ABNORMAL LOW (ref 22–32)
Calcium: 9 mg/dL (ref 8.9–10.3)
Chloride: 110 mmol/L (ref 98–111)
Creatinine, Ser: 0.91 mg/dL (ref 0.44–1.00)
GFR calc Af Amer: >60 mL/min (ref >60)
GFR calc non Af Amer: >60 mL/min (ref >60)
Glucose, Bld: 122 mg/dL — ABNORMAL HIGH (ref 70–99)
Potassium: 4.5 mmol/L (ref 3.5–5.1)
Sodium: 140 mmol/L (ref 135–145)

## 2019-06-10 LAB — MAGNESIUM: Magnesium: 2.1 mg/dL (ref 1.7–2.4)

## 2019-06-10 LAB — RPR: RPR Ser Ql: NONREACTIVE

## 2019-06-10 MED ORDER — ASPIRIN EC 81 MG PO TBEC
81.0000 mg | DELAYED_RELEASE_TABLET | Freq: Every day | ORAL | Status: DC
  Administered 2019-06-10 – 2019-06-11 (×2): 81 mg via ORAL
  Filled 2019-06-10 (×2): qty 1

## 2019-06-10 NOTE — Progress Notes (Addendum)
PROGRESS NOTE  Gabriela Pham [id number] DOB: 01-20-1941 DOA: 06/07/2019 PCP: Toni Arthurs, NP  HPI/Recap of past 23 hours: 78 year old female with past medical history of diabetes, morbid obesity, hypertension and gout admitted on 11/20 with complaints of left ankle pain and at that time was noted to be confused and weak.  Patient found to have an episode of acute gout and started on treatment.  She remained confused and initial work-up negative, attributed to possible dementia.  This morning, patient is fully awake and alert x3.  She is appropriate and knows who the president is as well as what year it is.  She has no complaints, ankle pain is gone.  She still feels weak though.  Assessment/Plan: Principal Problem:   Acute encephalopathy: Suspect may be secondary to uremia from acute kidney injury.  Patient also with a mildly elevated ammonia level and after starting lactulose yesterday, appears to be much more improved.  Unclear as she does not have any liver issues.  Encephalopathy appears to have resolved. Active Problems:   Gout: Treated with allopurinol and colchicine.  Appears to be rapidly improving.  Would favor stopping her colchicine to prevent further diarrhea.   Ataxia   Hypertension continue home medications.    Diabetes mellitus without complication ([place]): 123456 at 6.5 not in great control.  CBGs here during hospitalization have been stable, under 160.    Morbid obesity [facility]): Patient meets criteria BMI greater than 35+ history of diabetes and hypertension.    Diarrhea: She is on scheduled Lomotil and Bentyl.  As her gout is coming up, will stop colchicine which can be a contributing factor to this.  AKI: Secondary to diarrhea and dehydration.  Resolved with IV fluids.  Code Status: Full code  Family Communication: Updated family by phone.  Disposition Plan: Patient's mentation much improved.  Have asked physical therapy to resee to reevaluate to see if  she still needs skilled nursing.   Consultants:  None  Procedures:  None  Antimicrobials:  None  DVT prophylaxis: Subcu heparin   Objective: Vitals:   06/10/19 0634 06/10/19 1026  BP: (!) 142/71 112/71  Pulse: 67 75  Resp: 18 20  Temp: 98.6 F (37 C) 98 F (36.7 C)  SpO2: 96% 95%    Intake/Output Summary (Last 24 hours) at 06/10/2019 1251 Last data filed at 06/10/2019 1000 Gross per 24 hour  Intake 1699.56 ml  Output 1900 ml  Net -200.44 ml   Filed Weights   06/07/19 0939  Weight: 95.3 kg   Body mass index is 36.05 kg/m.  Exam:   General: Alert and oriented x3, no acute distress  HEENT: Extraocular atraumatic, mucous membranes are moist  Cardiovascular: Regular rate and rhythm, S1-S2  Respiratory: Clear auscultation bilaterally  Abdomen: Soft, nontender, nondistended, positive bowel sounds  Musculoskeletal: No clubbing or cyanosis, trace pitting edema  Skin: No skin breaks, tears or lesions  Psychiatry: Appropriate, no evidence of psychoses   Data Reviewed: CBC: Recent Labs  Lab 06/07/19 0948 06/08/19 1640 06/10/19 0625  WBC 11.3* 9.8 6.9  NEUTROABS 8.9* 7.5  --   HGB 12.3 11.6* 10.4*  HCT 38.2 36.8 32.0*  MCV 81.1 84.0 82.5  PLT 143* 168 [id number]*   Basic Metabolic Panel: Recent Labs  Lab 06/07/19 0948 06/08/19 1640 06/09/19 1052 06/10/19 0625  NA 136 137 138 140  K 4.1 4.3 4.3 4.5  CL 104 104 106 110  CO2 20* 21* 20* 20*  GLUCOSE 163* 139* 177* 122*  BUN 24* 46* 45* 38*  CREATININE 0.91 1.31* 1.04* 0.91  CALCIUM 9.1 9.2 9.2 9.0  MG  --   --   --  2.1   GFR: Estimated Creatinine Clearance: 57 mL/min (by C-G formula based on SCr of 0.91 mg/dL). Liver Function Tests: Recent Labs  Lab 06/07/19 0948 06/08/19 1640 06/09/19 1052  AST 22 74* 90*  ALT 14 34 50*  ALKPHOS 61 91 100  BILITOT 1.0 0.8 0.5  PROT 8.0 7.4 7.2  ALBUMIN 3.8 3.6 3.2*   No results for input(s): LIPASE, AMYLASE in the last 168 hours. Recent Labs   Lab 06/09/19 1052  AMMONIA 49*   Coagulation Profile: No results for input(s): INR, PROTIME in the last 168 hours. Cardiac Enzymes: No results for input(s): CKTOTAL, CKMB, CKMBINDEX, TROPONINI in the last 168 hours. BNP (last 3 results) No results for input(s): PROBNP in the last 8760 hours. HbA1C: No results for input(s): HGBA1C in the last 72 hours. CBG: Recent Labs  Lab 06/09/19 1124 06/09/19 1806 06/09/19 2202 06/10/19 0750 06/10/19 1228  GLUCAP 138* 105* 102* 111* 137*   Lipid Profile: No results for input(s): CHOL, HDL, LDLCALC, TRIG, CHOLHDL, LDLDIRECT in the last 72 hours. Thyroid Function Tests: Recent Labs    06/09/19 1052  TSH 2.565  FREET4 0.80   Anemia Panel: Recent Labs    06/09/19 1052  VITAMINB12 740   Urine analysis:    Component Value Date/Time   COLORURINE YELLOW (A) 06/07/2019 1054   APPEARANCEUR CLEAR (A) 06/07/2019 1054   LABSPEC 1.013 06/07/2019 1054   PHURINE 5.0 06/07/2019 1054   GLUCOSEU NEGATIVE 06/07/2019 1054   HGBUR NEGATIVE 06/07/2019 1054   [place] 06/07/2019 1054   KETONESUR NEGATIVE 06/07/2019 1054   PROTEINUR NEGATIVE 06/07/2019 1054   NITRITE NEGATIVE 06/07/2019 1054   LEUKOCYTESUR NEGATIVE 06/07/2019 1054   Sepsis Labs: @LABRCNTIP (procalcitonin:4,lacticidven:4)  ) Recent Results (from the past 240 hour(s))  Urine culture     Status: None   Collection Time: 06/07/19 10:54 AM   Specimen: Urine, Random  Result Value Ref Range Status   Specimen Description   Final    URINE, RANDOM Performed at [facility], [address] [place], [place], [place] [address]    Special Requests   Final    NONE Performed at [facility], [address], [place], [place] [address]    Culture   Final    NO GROWTH Performed at [facility], [place] [address], [place], [place] [address]    Report Status 06/08/2019 FINAL  Final  SARS CORONAVIRUS 2 (TAT 6-24 HRS) Nasopharyngeal Nasopharyngeal Swab      Status: None   Collection Time: 06/07/19  3:30 PM   Specimen: Nasopharyngeal Swab  Result Value Ref Range Status   SARS Coronavirus 2 NEGATIVE NEGATIVE Final    Comment: (NOTE) SARS-CoV-2 target nucleic acids are NOT DETECTED. The SARS-CoV-2 RNA is generally detectable in upper and lower respiratory specimens during the acute phase of infection. Negative results do not preclude SARS-CoV-2 infection, do not rule out co-infections with other pathogens, and should not be used as the sole basis for treatment or other patient management decisions. Negative results must be combined with clinical observations, patient history, and epidemiological information. The expected result is Negative. Fact Sheet for Patients: SugarRoll.be Fact Sheet for Healthcare Providers: https://www.woods-mathews.com/ This test is not yet approved or cleared by the [place] FDA and  has been authorized for detection and/or diagnosis of SARS-CoV-2 by FDA under  an Emergency Use Authorization (EUA). This EUA will remain  in effect (meaning this test can be used) for the duration of the COVID-19 declaration under Section 56 4(b)(1) of the Act, 21 U.S.C. section 360bbb-3(b)(1), unless the authorization is terminated or revoked sooner. Performed at [facility], [place] [address], [place], [place] [address]       Studies: Mr Brain 16 Contrast  Result Date: 06/09/2019 CLINICAL DATA:  Altered level of consciousness, unexplained. Dizziness and altered mental status. History of breast cancer. EXAM: MRI HEAD WITHOUT CONTRAST TECHNIQUE: Multiplanar, multiecho pulse sequences of the brain and surrounding structures were obtained without intravenous contrast. COMPARISON:  CT head 06/08/2019. FINDINGS: Brain: No evidence for acute infarction, hemorrhage, mass lesion, hydrocephalus, or extra-axial fluid. Generalized atrophy. Extracerebral CSF collections over the frontal lobe  represent ex vacuo cortical volume loss. Mild subcortical and periventricular T2 and FLAIR hyperintensities, likely chronic microvascular ischemic change. Vascular: Normal flow voids. Skull and upper cervical spine: Normal marrow signal. Sinuses/Orbits: Negative. Other: None. IMPRESSION: Atrophy and small vessel disease. No acute intracranial findings. Electronically Signed   By: Staci Righter M.D.   On: 06/09/2019 18:15    Scheduled Meds: . allopurinol  100 mg Oral Daily  . colchicine  0.6 mg Oral BID  . dicyclomine  10 mg Oral TID AC  . divalproex  250 mg Oral BID  . enoxaparin (LOVENOX) injection  40 mg Subcutaneous Q24H  . insulin aspart  0-5 Units Subcutaneous QHS  . insulin aspart  0-9 Units Subcutaneous TID WC  . lactulose  20 g Oral Daily  . lisinopril  25 mg Oral Daily  . pantoprazole  40 mg Oral Daily  . rosuvastatin  10 mg Oral Daily  . sertraline  150 mg Oral Daily    Continuous Infusions: . sodium chloride 100 mL/hr at 06/10/19 1229     LOS: 2 days     Annita Brod, MD [facility]  To reach me or the doctor on call, go to: [email or web address] Password [email or web address]  06/10/2019, 12:51 PM

## 2019-06-10 NOTE — NC FL2 (Signed)
[place] LEVEL OF CARE SCREENING TOOL     IDENTIFICATION  Patient Name: Gabriela Pham Birthdate: 13-May-1941 Sex: female Admission Date (Current Location): 06/07/2019  [place] and [place] Number:  Engineering geologist and Address:  [facility], [address], [place], [place] [address]      Provider Number: [id number]  Attending Physician Name and Address:  Annita Brod, MD  Relative Name and Phone Number:  Irja Marrazzo [phone]    Current Level of Care: Hospital Recommended Level of Care: [place] Prior Approval Number:    Date Approved/Denied:   PASRR Number: [id number]  Discharge Plan: SNF    Current Diagnoses: Patient Active Problem List   Diagnosis Date Noted  . Ataxia 06/08/2019  . Gout 06/07/2019  . 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    Orientation RESPIRATION BLADDER Height & Weight     Self, Place  Normal Continent Weight: 95.3 kg Height:  5\' 4"  (162.6 cm)  BEHAVIORAL SYMPTOMS/MOOD NEUROLOGICAL BOWEL NUTRITION STATUS      Continent Diet  AMBULATORY STATUS COMMUNICATION OF NEEDS Skin   Extensive Assist Verbally Normal                       Personal Care Assistance Level of Assistance  Bathing, Feeding, Dressing Bathing Assistance: Maximum assistance Feeding assistance: Limited assistance Dressing Assistance: Maximum assistance     Functional Limitations Info             SPECIAL CARE FACTORS FREQUENCY  PT (By licensed PT), OT (By licensed OT)     PT Frequency: 5 times per week OT Frequency: 5 times per week            Contractures Contractures Info: Not present    Additional Factors Info  Code Status, Allergies Code Status Info: Full Allergies Info: Metformin           Current Medications (06/10/2019):  This is the current hospital active medication list Current Facility-Administered Medications  Medication  Dose Route Frequency Provider Last Rate Last Dose  . 0.9 %  sodium chloride infusion   Intravenous Continuous Lavina Hamman, MD 100 mL/hr at 06/10/19 0400    . acetaminophen (TYLENOL) tablet 650 mg  650 mg Oral Q6H PRN Lavina Hamman, MD       Or  . acetaminophen (TYLENOL) suppository 650 mg  650 mg Rectal Q6H PRN Lavina Hamman, MD      . allopurinol (ZYLOPRIM) tablet 100 mg  100 mg Oral Daily Lavina Hamman, MD   100 mg at 06/09/19 0930  . colchicine tablet 0.6 mg  0.6 mg Oral BID Lavina Hamman, MD   0.6 mg at 06/09/19 2228  . dicyclomine (BENTYL) capsule 10 mg  10 mg Oral TID AC Lavina Hamman, MD   10 mg at 06/09/19 1812  . diphenoxylate-atropine (LOMOTIL) 2.5-0.025 MG per tablet 1 tablet  1 tablet Oral BID PRN Lavina Hamman, MD      . divalproex (DEPAKOTE) DR tablet 250 mg  250 mg Oral BID Lavina Hamman, MD   250 mg at 06/09/19 2228  . enoxaparin (LOVENOX) injection 40 mg  40 mg Subcutaneous Q24H Lavina Hamman, MD   40 mg at 06/09/19 2228  . HYDROcodone-acetaminophen (NORCO/VICODIN) 5-325 MG per tablet 1 tablet  1 tablet Oral Q4H PRN Lavina Hamman, MD      . insulin aspart (novoLOG) injection 0-5  Units  0-5 Units Subcutaneous QHS Berle Mull M, MD      . insulin aspart (novoLOG) injection 0-9 Units  0-9 Units Subcutaneous TID WC Lavina Hamman, MD   1 Units at 06/09/19 1210  . lactulose (CHRONULAC) 10 GM/15ML solution 20 g  20 g Oral Daily Lavina Hamman, MD   20 g at 06/09/19 1812  . lisinopril (ZESTRIL) tablet 25 mg  25 mg Oral Daily Lavina Hamman, MD   25 mg at 06/09/19 0930  . ondansetron (ZOFRAN) tablet 4 mg  4 mg Oral Q6H PRN Lavina Hamman, MD       Or  . ondansetron [facility]) injection 4 mg  4 mg Intravenous Q6H PRN Lavina Hamman, MD      . pantoprazole (PROTONIX) EC tablet 40 mg  40 mg Oral Daily Lavina Hamman, MD   40 mg at 06/09/19 0930  . rosuvastatin (CRESTOR) tablet 10 mg  10 mg Oral Daily Lavina Hamman, MD   10 mg at 06/09/19 0930  . senna-docusate  (Senokot-S) tablet 1 tablet  1 tablet Oral QHS PRN Lavina Hamman, MD      . sertraline (ZOLOFT) tablet 150 mg  150 mg Oral Daily Lavina Hamman, MD   150 mg at 06/09/19 0930     Discharge Medications: Please see discharge summary for a list of discharge medications.  Relevant Imaging Results:  Relevant Lab Results:   Additional Information SS# [id number]  Shelbie Hutching, RN

## 2019-06-10 NOTE — Progress Notes (Signed)
PT Cancellation Note  Patient Details Name: Gabriela Pham MRN: [id number] DOB: 1941-01-10   Cancelled Treatment:    Reason Eval/Treat Not Completed: Other (comment).  Pt currently eating lunch in bed.  Will re-attempt PT session at a later date/time.   Leitha Bleak, PT 06/10/19, 1:57 PM [phone]

## 2019-06-10 NOTE — Plan of Care (Signed)

## 2019-06-10 NOTE — TOC Initial Note (Signed)
Transition of Care [facility]) - Initial/Assessment Note    Patient Details  Name: Lam Stiles MRN: [id number] Date of Birth: 1940/12/02  Transition of Care [facility]) CM/SW Contact:    Shelbie Hutching, RN Phone Number: 06/10/2019, 8:45 AM  Clinical Narrative:                 Patient admitted for weakness, gout, left leg pain.  Patient is from home with her husband.  Patient reports that she has a daughter and 2 sons  , daughter lives in [place], one son lives here and the other in [place].  Patient gives permission to speak with daughter Sharyn Lull.  Patient reports that she is independent at home and drives.  Patient reports no DME needs.   PT has worked with patient and recommends SNF.  Patient seems a little confused about the idea of SNF and would like for [facility] to talk with daughter Sharyn Lull.   Patient reports that she has been to OP PT in the past.    Patient's daughter Sharyn Lull would like for the patient to go to [facility] if possible because it is close to where the patient and her husband live.  RNCM will keep daughter updated.   Expected Discharge Plan: Skilled Nursing Facility Barriers to Discharge: Continued Medical Work up   Patient Goals and CMS Choice   CMS Medicare.gov Compare Post Acute Care list provided to:: Patient Choice offered to / list presented to : Patient  Expected Discharge Plan and Services Expected Discharge Plan: [place] In-house Referral: Clinical Social Work Discharge Planning Services: CM Consult Post Acute Care Choice: [place] Living arrangements for the past 2 months: Apartment                                      Prior Living Arrangements/Services Living arrangements for the past 2 months: Apartment Lives with:: Spouse Patient language and need for interpreter reviewed:: Yes Do you feel safe going back to the place where you live?: Yes      Need for Family Participation in Patient Care: Yes  (Comment)(weakness) Care giver support system in place?: Yes (comment)(husband) Current home services: Housekeeping(housekeeper every 2 weeks) Criminal Activity/Legal Involvement Pertinent to Current Situation/Hospitalization: No - Comment as needed  Activities of Daily Living Home Assistive Devices/Equipment: None ADL Screening (condition at time of admission) Patient's cognitive ability adequate to safely complete daily activities?: Yes Is the patient deaf or have difficulty hearing?: No Does the patient have difficulty seeing, even when wearing glasses/contacts?: No Does the patient have difficulty concentrating, remembering, or making decisions?: No Patient able to express need for assistance with ADLs?: Yes Does the patient have difficulty dressing or bathing?: No Independently performs ADLs?: Yes (appropriate for developmental age) Does the patient have difficulty walking or climbing stairs?: No Weakness of Legs: None Weakness of Arms/Hands: None  Permission Sought/Granted Permission sought to share information with : Case Manager, Family Supports Permission granted to share information with : Yes, Verbal Permission Granted     Permission granted to share info w AGENCY: SNF's  Permission granted to share info w Relationship: daughter Sharyn Lull, husband Marcello Moores     Emotional Assessment Appearance:: Appears stated age Attitude/Demeanor/Rapport: Engaged Affect (typically observed): Accepting Orientation: : Oriented to Self, Oriented to Place Alcohol / Substance Use: Not Applicable Psych Involvement: No (comment)  Admission diagnosis:  Acute gout of left foot,  unspecified cause [M10.9] Patient Active Problem List   Diagnosis Date Noted  . Ataxia 06/08/2019  . Gout 06/07/2019  . 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

## 2019-06-11 DIAGNOSIS — N179 Acute kidney failure, unspecified: Secondary | ICD-10-CM

## 2019-06-11 LAB — CBC
HCT: 33.7 % — ABNORMAL LOW (ref 36.0–46.0)
Hemoglobin: 10.4 g/dL — ABNORMAL LOW (ref 12.0–15.0)
MCH: 26.2 pg (ref 26.0–34.0)
MCHC: 30.9 g/dL (ref 30.0–36.0)
MCV: 84.9 fL (ref 80.0–100.0)
Platelets: 144 10*3/uL — ABNORMAL LOW (ref 150–400)
RBC: 3.97 MIL/uL (ref 3.87–5.11)
RDW: 15.1 % (ref 11.5–15.5)
WBC: 7.3 10*3/uL (ref 4.0–10.5)
nRBC: 0 % (ref 0.0–0.2)

## 2019-06-11 LAB — BASIC METABOLIC PANEL
Anion gap: 10 (ref 5–15)
BUN: 31 mg/dL — ABNORMAL HIGH (ref 8–23)
CO2: 22 mmol/L (ref 22–32)
Calcium: 9.2 mg/dL (ref 8.9–10.3)
Chloride: 108 mmol/L (ref 98–111)
Creatinine, Ser: 0.91 mg/dL (ref 0.44–1.00)
GFR calc Af Amer: >60 mL/min (ref >60)
GFR calc non Af Amer: >60 mL/min (ref >60)
Glucose, Bld: 119 mg/dL — ABNORMAL HIGH (ref 70–99)
Potassium: 4.2 mmol/L (ref 3.5–5.1)
Sodium: 140 mmol/L (ref 135–145)

## 2019-06-11 LAB — GLUCOSE, CAPILLARY
Glucose-Capillary: 101 mg/dL — ABNORMAL HIGH (ref 70–99)
Glucose-Capillary: 108 mg/dL — ABNORMAL HIGH (ref 70–99)
Glucose-Capillary: 114 mg/dL — ABNORMAL HIGH (ref 70–99)

## 2019-06-11 LAB — AMMONIA: Ammonia: 10 umol/L (ref 9–35)

## 2019-06-11 MED ORDER — COLCHICINE 0.6 MG PO TABS: 0.6000 mg | ORAL_TABLET | Freq: Every day | ORAL | 0 refills | Status: AC

## 2019-06-11 MED ORDER — LOPERAMIDE HCL 2 MG PO TABS: 2.0000 mg | ORAL_TABLET | Freq: Four times a day (QID) | ORAL | 0 refills | Status: DC | PRN

## 2019-06-11 MED ORDER — LISINOPRIL 5 MG PO TABS: 25.0000 mg | ORAL_TABLET | Freq: Every day | ORAL | 0 refills | Status: AC

## 2019-06-11 NOTE — TOC Transition Note (Signed)
Transition of Care [facility]) - CM/SW Discharge Note   Patient Details  Name: Giavona Bathgate MRN: [id number] Date of Birth: 07/28/1940  [facility] CM/SW Contact:  Shelbie Hutching, RN Phone Number: 06/11/2019, 2:28 PM   Clinical Narrative:    Patient will discharge today to [facility].  Patient will go to room 202, bedside RN will call report to [phone].  Patient's son Juliane Lack needs to sign paper work at [facility] before patient can be admitted to facility, he will do that this afternoon.  Once paperwork signed at facility patient will transport via EMS.  Family has been updated.     Final next level of care: Skilled Nursing Facility Barriers to Discharge: Barriers Resolved   Patient Goals and CMS Choice   CMS Medicare.gov Compare Post Acute Care list provided to:: Patient Represenative (must comment)(daughter Sharyn Lull) Choice offered to / list presented to : Adult Children(daughter and son)  Discharge Placement              Patient [facility] at: [facility] Patient to be transferred to facility by: [facility] Name of family member notified: Sharyn Lull, and Juliane Lack- son and daughter Patient and family notified of of transfer: 06/11/19  Discharge Plan and Services In-house Referral: Clinical Social Work Discharge Planning Services: CM Consult Post Acute Care Choice: [place]                               Social Determinants of Health (SDOH) Interventions     Readmission Risk Interventions No flowsheet data found.

## 2019-06-11 NOTE — Discharge Summary (Signed)
Discharge Summary  Gabriela Pham [id number] DOB: 1940/10/19  PCP: Toni Arthurs, NP  Admit date: 06/07/2019 Discharge date: 06/11/2019  Time spent: 25 minutes  Recommendations for Outpatient Follow-up:  1. Medication change: Zestril increased from 10 mg to 25 mg daily 2. Medication clarification: Patient is on colchicine 0.6 mg daily.  If she continues to have issues with significant diarrhea, discontinue this medication altogether. 3. Patient will follow up with her PCP in the next 1 month.  Discharge Diagnoses:  Active Hospital Problems   Diagnosis Date Noted  . Acute encephalopathy   . Hypertension   . Diabetes mellitus without complication ([place])   . Morbid obesity ([place])   . Diarrhea   . Ataxia 06/08/2019  . Gout 06/07/2019    Resolved Hospital Problems   Diagnosis Date Noted Date Resolved  . AKI (acute kidney injury) ([place])  06/10/2019    Discharge Condition: Improved, being discharged to short-term skilled nursing  Diet recommendation: Carb modified, low-sodium  Vitals:   06/11/19 0808 06/11/19 1155  BP: (!) 171/102 (!) 120/102  Pulse: 73 74  Resp:    Temp: 98.3 F (36.8 C)   SpO2: 98%     History of present illness:  78 year old female with past medical history of diabetes, morbid obesity, hypertension and gout admitted on 11/20 with complaints of left ankle pain and at that time was noted to be confused and weak.  Patient found to have an episode of acute gout and started on treatment.   Hospital Course:  Principal Problem:   Acute encephalopathy: Work-up initially was negative with patient having little improvement.  CT scan of head unremarkable as were electrolytes.  She did have some mild acute kidney injury which is since resolved.  Cause may have been from dehydration as well as diarrhea brought on by colchicine.  As of 11/23, patient is mentating clearly and able to answer questions appropriately and say who the president is.  She was  noted incidentally to have very minimally elevated ammonia level and was started on lactulose briefly.  Should patient's confusion return, and I do not believe that it well, but should it return, will recommend checking an ammonia level and if elevated, try a course of lactulose.  Patient has no history of any liver issues.    Active Problems:   Gout: Patient's initial flare is over.  She will continue allopurinol and is back on prophylaxis dose of colchicine.  With reports of continuous issues of diarrhea, will recommend that if diarrhea persists, she discontinue colchicine altogether.    Ataxia: Resolved, may have been an issue with encephalopathy.  She is however deconditioning going to short-term skilled nursing.    Hypertension: Stable.  Her ACE inhibitor has been increased to 25 mg p.o. daily.    Diabetes mellitus without complication ([place]): 123456 at 6.5, noting great control.  CBG stable during his hospitalization.  Continue home medications upon discharge.    Morbid obesity [facility]): Patient meets criteria BMI greater than 35+ history of diabetes and hypertension.    Diarrhea: Patient was on scheduled Lomotil and Bentyl.  As her gout is resolving, have changed her colchicine back to once a day and if diarrhea does persist, discontinue colchicine altogether.   Procedures:  None  Consultations:  None  Discharge Exam: BP (!) 120/102   Pulse 74   Temp 98.3 F (36.8 C) (Oral)   Resp 17   Ht 5\' 4"  (1.626 m)   Wt 95.3 kg   SpO2 98%  BMI 36.05 kg/m   General: Alert and oriented x3, no acute distress Cardiovascular: Regular rate and rhythm, S1-S2 Respiratory: Clear to auscultation bilaterally  Discharge Instructions You were cared for by a hospitalist during your hospital stay. If you have any questions about your discharge medications or the care you received while you were in the hospital after you are discharged, you can call the unit and asked to speak with the hospitalist on  call if the hospitalist that took care of you is not available. Once you are discharged, your primary care physician will handle any further medical issues. Please note that NO REFILLS for any discharge medications will be authorized once you are discharged, as it is imperative that you return to your primary care physician (or establish a relationship with a primary care physician if you do not have one) for your aftercare needs so that they can reassess your need for medications and monitor your lab values.  Discharge Instructions    Diet - low sodium heart healthy   Complete by: As directed    Increase activity slowly   Complete by: As directed      Allergies as of 06/11/2019      Reactions   Metformin Diarrhea   Severe diarrhea      Medication List    STOP taking these medications   predniSONE 20 MG tablet Commonly known as: DELTASONE     TAKE these medications   allopurinol 100 MG tablet Commonly known as: ZYLOPRIM Take 1 tablet (100 mg total) by mouth daily.   aspirin EC 81 MG tablet Take 81 mg by mouth daily.   Cholecalciferol 50 MCG (2000 UT) Tabs Take 2,000 Units by mouth daily.   colchicine 0.6 MG tablet Take 1 tablet (0.6 mg total) by mouth daily. What changed:   how much to take  how to take this  when to take this  additional instructions   dicyclomine 10 MG capsule Commonly known as: BENTYL Take 10 mg by mouth 3 (three) times daily before meals.   divalproex 250 MG DR tablet Commonly known as: DEPAKOTE Take 250 mg by mouth 2 (two) times daily.   ibuprofen 800 MG tablet Commonly known as: ADVIL Take 800 mg by mouth 2 (two) times daily.   Jardiance 10 MG Tabs tablet Generic drug: empagliflozin Take 10 mg by mouth daily.   Lactobacillus Probiotic Tabs Take 1 capsule by mouth daily.   lisinopril 5 MG tablet Commonly known as: ZESTRIL Take 5 tablets (25 mg total) by mouth daily. Start taking on: June 12, 2019 What changed:    medication strength  how much to take   loperamide 2 MG tablet Commonly known as: Imodium A-D Take 1 tablet (2 mg total) by mouth 4 (four) times daily as needed for diarrhea or loose stools.   omeprazole 20 MG capsule Commonly known as: PRILOSEC Take 20 mg by mouth daily.   rosuvastatin 10 MG tablet Commonly known as: CRESTOR Take 10 mg by mouth daily.   sertraline 100 MG tablet Commonly known as: ZOLOFT Take 150 mg by mouth daily.   VITAMIN B 12 PO Take 1,000 mcg by mouth daily.   vitamin E 400 UNIT capsule Take 400 Units by mouth at bedtime.      Allergies  Allergen Reactions  . Metformin Diarrhea    Severe diarrhea    Contact information for follow-up providers    Toni Arthurs, NP Follow up in 1 month(s).   Specialty: Family Medicine Contact  information: [place] [address] [phone]            Contact information for after-discharge care    Destination    [facility] .   Service: Skilled Nursing Contact information: [address] [place] [phone]                   The results of significant diagnostics from this hospitalization (including imaging, microbiology, ancillary and laboratory) are listed below for reference.    Significant Diagnostic Studies: Ct Head Wo Contrast  Result Date: 06/08/2019 CLINICAL DATA:  78 year old female with dizziness and ataxia EXAM: CT HEAD WITHOUT CONTRAST TECHNIQUE: Contiguous axial images were obtained from the base of the skull through the vertex without intravenous contrast. COMPARISON:  None. FINDINGS: Brain: No evidence of acute infarction, hemorrhage, hydrocephalus, extra-axial collection or mass lesion/mass effect. Cerebral cortical volume loss commensurate with age. Vascular: No hyperdense vessel or unexpected calcification. Skull: Normal. Negative for fracture or focal lesion. Sinuses/Orbits: No acute finding. Other:  None. IMPRESSION: 1. No acute intracranial abnormality. 2. 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 Electronically Signed   By: Jacqulynn Cadet M.D.   On: 06/08/2019 14:56   Mr Brain Wo Contrast  Result Date: 06/09/2019 CLINICAL DATA:  Altered level of consciousness, unexplained. Dizziness and altered mental status. History of breast cancer. EXAM: MRI HEAD WITHOUT CONTRAST TECHNIQUE: Multiplanar, multiecho pulse sequences of the brain and surrounding structures were obtained without intravenous contrast. COMPARISON:  CT head 06/08/2019. FINDINGS: Brain: No evidence for acute infarction, hemorrhage, mass lesion, hydrocephalus, or extra-axial fluid. Generalized atrophy. Extracerebral CSF collections over the frontal lobe represent ex vacuo cortical volume loss. Mild subcortical and periventricular T2 and FLAIR hyperintensities, likely chronic microvascular ischemic change. Vascular: Normal flow voids. Skull and upper cervical spine: Normal marrow signal. Sinuses/Orbits: Negative. Other: None. IMPRESSION: Atrophy and small vessel disease. No acute intracranial findings. Electronically Signed   By: Staci Righter M.D.   On: 06/09/2019 18:15   Dg Chest Port 1 View  Result Date: 06/07/2019 CLINICAL DATA:  Weakness and confusion today. Hx of breast cancer, left mastectomy, diabetes, HTN. Nonsmoker. EXAM: PORTABLE CHEST - 1 VIEW COMPARISON:  none FINDINGS: Lungs are clear. Heart size and mediastinal contours are within normal limits. No effusion. Surgical clips project over the right lower chest. Right shoulder arthroplasty hardware partially visualized. IMPRESSION: No acute cardiopulmonary disease. Electronically Signed   By: Lucrezia Europe M.D.   On: 06/07/2019 10:16   Dg Foot 2 Views Left  Result Date: 06/07/2019 CLINICAL DATA:  Patient complains of medial left foot pain after moving a couch today. Hx of gout, dm, prior sx to left foot. Foot appeared red, swollen, and warm to the touch. EXAM: LEFT  FOOT - 2 VIEW COMPARISON:  None. FINDINGS: No fracture or bone lesion. Previous bunion and hallux valgus surgery. Previous fusion of the PIP joint of the second toe. 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 There is degenerative spurring at the naviculocuneiform and cuneiform metatarsal articulations on the lateral view. Small apparent erosion along the distal medial aspect of the medial cuneiform at the first cuneiform metatarsal articulation. No other periarticular erosions. There is soft tissue swelling along the medial margin of the midfoot. Moderate-sized plantar calcaneal spur. IMPRESSION: 1. No fracture or dislocation. 2. Periarticular erosion of the medial cuneiform at the medial cuneiform first metatarsal articulation. There is adjacent soft tissue swelling. Findings support gout. 3. No other evidence of  an acute abnormality. Electronically Signed   By: Lajean Manes M.D.   On: 06/07/2019 15:56    Microbiology: Recent Results (from the past 240 hour(s))  Urine culture     Status: None   Collection Time: 06/07/19 10:54 AM   Specimen: Urine, Random  Result Value Ref Range Status   Specimen Description   Final    URINE, RANDOM Performed at [facility], [address] [place], [place] [address]    Special Requests   Final    NONE Performed at [facility], [address] [place], [place] [address]    Culture   Final    NO GROWTH Performed at [facility], [place] [address], [place], [place] [address]    Report Status 06/08/2019 FINAL  Final  SARS CORONAVIRUS 2 (TAT 6-24 HRS) Nasopharyngeal Nasopharyngeal Swab     Status: None   Collection Time: 06/07/19  3:30 PM   Specimen: Nasopharyngeal Swab  Result Value Ref Range Status   SARS Coronavirus 2 NEGATIVE NEGATIVE Final    Comment: (NOTE) SARS-CoV-2 target nucleic acids are NOT DETECTED. The SARS-CoV-2 RNA is generally detectable in upper and lower respiratory specimens during the  acute phase of infection. Negative results do not preclude SARS-CoV-2 infection, do not rule out co-infections with other pathogens, and should not be used as the sole basis for treatment or other patient management decisions. Negative results must be combined with clinical observations, patient history, and epidemiological information. The expected result is Negative. Fact Sheet for Patients: SugarRoll.be Fact Sheet for Healthcare Providers: https://www.woods-mathews.com/ This test is not yet approved or cleared by the [place] FDA and  has been authorized for detection and/or diagnosis of SARS-CoV-2 by FDA under an Emergency Use Authorization (EUA). This EUA will remain  in effect (meaning this test can be used) for the duration of the COVID-19 declaration under Section 56 4(b)(1) of the Act, 21 U.S.C. section 360bbb-3(b)(1), unless the authorization is terminated or revoked sooner. Performed at [facility], [place] [address], [place], [place] [address]      Labs: Basic Metabolic Panel: Recent Labs  Lab 06/07/19 0948 06/08/19 1640 06/09/19 1052 06/10/19 0625 06/11/19 0347  NA 136 137 138 140 140  K 4.1 4.3 4.3 4.5 4.2  CL 104 104 106 110 108  CO2 20* 21* 20* 20* 22  GLUCOSE 163* 139* 177* 122* 119*  BUN 24* 46* 45* 38* 31*  CREATININE 0.91 1.31* 1.04* 0.91 0.91  CALCIUM 9.1 9.2 9.2 9.0 9.2  MG  --   --   --  2.1  --    Liver Function Tests: Recent Labs  Lab 06/07/19 0948 06/08/19 1640 06/09/19 1052  AST 22 74* 90*  ALT 14 34 50*  ALKPHOS 61 91 100  BILITOT 1.0 0.8 0.5  PROT 8.0 7.4 7.2  ALBUMIN 3.8 3.6 3.2*   No results for input(s): LIPASE, AMYLASE in the last 168 hours. Recent Labs  Lab 06/09/19 1052 06/11/19 0347  AMMONIA 49* 10   CBC: Recent Labs  Lab 06/07/19 0948 06/08/19 1640 06/10/19 0625 06/11/19 0347  WBC 11.3* 9.8 6.9 7.3  NEUTROABS 8.9* 7.5  --   --   HGB 12.3 11.6* 10.4* 10.4*  HCT  38.2 36.8 32.0* 33.7*  MCV 81.1 84.0 82.5 84.9  PLT 143* 168 131* 144*   Cardiac Enzymes: No results for input(s): CKTOTAL, CKMB, CKMBINDEX, TROPONINI in the last 168 hours. BNP: BNP (last 3 results) No results for  input(s): BNP in the last 8760 hours.  ProBNP (last 3 results) No results for input(s): PROBNP in the last 8760 hours.  CBG: Recent Labs  Lab 06/10/19 1228 06/10/19 1646 06/10/19 2118 06/11/19 0745 06/11/19 1220  GLUCAP 137* 106* 101* 108* 114*       Signed:  Annita Brod, MD [facility] 06/11/2019, 12:56 PM

## 2019-06-11 NOTE — TOC Transition Note (Signed)
Transition of Care [facility]) - CM/SW Discharge Note   Patient Details  Name: Gabriela Pham MRN: [id number] Date of Birth: June 27, 1941  [facility] CM/SW Contact:  Shelbie Hutching, RN Phone Number: 06/11/2019, 3:31 PM   Clinical Narrative:     Patient's son Juliane Lack is at the facility signing admission paperwork.  Bedside RN is calling report to [facility].   Final next level of care: Skilled Nursing Facility Barriers to Discharge: Barriers Resolved   Patient Goals and CMS Choice   CMS Medicare.gov Compare Post Acute Care list provided to:: Patient Represenative (must comment)(daughter Sharyn Lull) Choice offered to / list presented to : Adult Children(daughter and son)  Discharge Placement              Patient chooses bed at: [facility] Patient to be transferred to facility by: [facility] Name of family member notified: Sharyn Lull, and Juliane Lack- son and daughter Patient and family notified of of transfer: 06/11/19  Discharge Plan and Services In-house Referral: Clinical Social Work Discharge Planning Services: CM Consult Post Acute Care Choice: [place]                               Social Determinants of Health (SDOH) Interventions     Readmission Risk Interventions No flowsheet data found.

## 2019-06-11 NOTE — Progress Notes (Signed)
Called [facility] and gave report to [place]. IV removed before patient discharged. Patient going via EMS.

## 2019-06-11 NOTE — Progress Notes (Signed)
Physical Therapy Treatment Patient Details Name: Gabriela Pham MRN: [id number] DOB: 07/10/1941 Today's Date: 06/11/2019    History of Present Illness Patient is a pleasantly confused 78 year old female who presents to the ED for gout L LE and acute encephalopathy. PMH includes R reverse total shoulder arthroplasty, breast cancer, cervical cancer, cholelithiasis, depression, Type II DM, GERD, and HTN. Patient is a poor historian and limited in history giving.    PT Comments    Pt resting in bed upon PT arrival.  General confusion noted.  Able to perform semi-supine to sitting edge of bed with SBA.  No loss of balance in sitting noted today.  With multiple trials pt at most able to achieve 3/4th stand with bed height elevated and max assist of therapist (and L knee blocked).  No c/o L ankle pain today and no difficulties WB'ing through L LE noted.  Able to scoot to L onto edge of bed with SBA to CGA for safety with cueing for technique and extra time.  1 assist to lay back down in bed.  Will continue to focus on strengthening and progressive functional mobility per pt tolerance.    Follow Up Recommendations  SNF     Equipment Recommendations  Rolling walker with 5" wheels;3in1 (PT);Wheelchair (measurements PT);Wheelchair cushion (measurements PT)    Recommendations for Other Services       Precautions / Restrictions Precautions Precautions: Fall Restrictions Weight Bearing Restrictions: No    Mobility  Bed Mobility Overal bed mobility: Needs Assistance Bed Mobility: Supine to Sit;Sit to Supine     Supine to sit: Supervision;HOB elevated Sit to supine: Min assist;Mod assist;HOB elevated   General bed mobility comments: extra effort/time to perform semi-supine to sit on own with use of bed rail; assist for B LE's sit to semi-supine with use of bed rail  Transfers Overall transfer level: Needs assistance Equipment used: Rolling walker (2 wheeled) Transfers: Sit  to/from Stand Sit to Stand: From elevated surface         General transfer comment: x2 trials with bed height at normal height (unable to stand with 1 assist); x2 trials with bed height elevated (pt able to come to 3/4th stand with max assist x1); vc's and tactile cues for UE/LE placement; assist to initiate and come to 3/4th stand  Ambulation/Gait             General Gait Details: unable to attempt   Stairs             Wheelchair Mobility    Modified Rankin (Stroke Patients Only)       Balance Overall balance assessment: Needs assistance Sitting-balance support: No upper extremity supported;Feet supported Sitting balance-Leahy Scale: Good Sitting balance - Comments: steady sitting reaching within BOS       Standing balance comment: unable to stand fully upright to assess                            Cognition Arousal/Alertness: Awake/alert Behavior During Therapy: Anxious Overall Cognitive Status: No family/caregiver present to determine baseline cognitive functioning                                 General Comments: Oriented to person, month, year, and close to day; generalized confusion noted regarding current situation and also place (pt reports being in medical facility but appeared very suprised she was at  [facility] hospital)      Exercises      General Comments  Pt agreeable to PT session.      Pertinent Vitals/Pain Pain Assessment: No/denies pain Pain Intervention(s): Limited activity within patient's tolerance;Monitored during session;Repositioned  Vitals (HR and O2 on room air) stable and WFL throughout treatment session.    Home Living                      Prior Function            PT Goals (current goals can now be found in the care plan section) Acute Rehab PT Goals Patient Stated Goal: to walk again PT Goal Formulation: With patient Time For Goal Achievement: 06/22/19 Potential to Achieve Goals:  Fair Progress towards PT goals: Progressing toward goals    Frequency    Min 2X/week      PT Plan Current plan remains appropriate    Co-evaluation              AM-PAC PT "6 Clicks" Mobility   Outcome Measure  Help needed turning from your back to your side while in a flat bed without using bedrails?: A Little Help needed moving from lying on your back to sitting on the side of a flat bed without using bedrails?: A Little Help needed moving to and from a bed to a chair (including a wheelchair)?: Total Help needed standing up from a chair using your arms (e.g., wheelchair or bedside chair)?: Total Help needed to walk in hospital room?: Total Help needed climbing 3-5 steps with a railing? : Total 6 Click Score: 10    End of Session Equipment Utilized During Treatment: Gait belt Activity Tolerance: Patient limited by fatigue Patient left: in bed;with call bell/phone within reach;with bed alarm set Nurse Communication: Mobility status;Precautions;Other (comment)(Purewick removed) PT Visit Diagnosis: Unsteadiness on feet (R26.81);Other abnormalities of gait and mobility (R26.89);Repeated falls (R29.6);Muscle weakness (generalized) (M62.81);Pain Pain - Right/Left: Left Pain - part of body: Ankle and joints of foot     Time: [id number] PT Time Calculation (min) (ACUTE ONLY): 32 min  Charges:  $Therapeutic Activity: 23-37 mins                     Leitha Bleak, PT 06/11/19, 1:00 PM [phone]

## 2019-06-12 ENCOUNTER — Ambulatory Visit: Admit: 2019-06-12 | Payer: Medicare Other | Admitting: Internal Medicine

## 2019-06-12 SURGERY — COLONOSCOPY WITH PROPOFOL
Anesthesia: General

## 2019-06-15 LAB — VITAMIN B1: Vitamin B1 (Thiamine): 136.1 nmol/L (ref 66.5–200.0)

## 2019-07-08 DIAGNOSIS — R4182 Altered mental status, unspecified: Secondary | ICD-10-CM | POA: Insufficient documentation

## 2019-07-24 ENCOUNTER — Other Ambulatory Visit: Payer: Self-pay | Admitting: Gerontology

## 2019-07-24 DIAGNOSIS — R4182 Altered mental status, unspecified: Secondary | ICD-10-CM

## 2019-07-25 ENCOUNTER — Other Ambulatory Visit: Payer: Self-pay | Admitting: Gerontology

## 2019-07-25 DIAGNOSIS — R4182 Altered mental status, unspecified: Secondary | ICD-10-CM

## 2019-07-26 ENCOUNTER — Ambulatory Visit
Admission: RE | Admit: 2019-07-26 | Discharge: 2019-07-26 | Disposition: A | Discharge status: Home / Self Care | Payer: Medicare HMO | Source: Ambulatory Visit | Attending: Gerontology | Admitting: Gerontology

## 2019-07-26 ENCOUNTER — Other Ambulatory Visit: Payer: Self-pay

## 2019-07-26 DIAGNOSIS — R4182 Altered mental status, unspecified: Secondary | ICD-10-CM

## 2019-07-26 DIAGNOSIS — Z9221 Personal history of antineoplastic chemotherapy: Secondary | ICD-10-CM | POA: Insufficient documentation

## 2019-07-26 DIAGNOSIS — E119 Type 2 diabetes mellitus without complications: Secondary | ICD-10-CM | POA: Diagnosis not present

## 2019-07-26 DIAGNOSIS — I351 Nonrheumatic aortic (valve) insufficiency: Secondary | ICD-10-CM | POA: Insufficient documentation

## 2019-07-26 DIAGNOSIS — I10 Essential (primary) hypertension: Secondary | ICD-10-CM | POA: Insufficient documentation

## 2019-07-26 NOTE — Progress Notes (Signed)
*  PRELIMINARY RESULTS* Echocardiogram 2D Echocardiogram has been performed.  Sherrie Sport 07/26/2019, 3:32 PM

## 2019-07-31 ENCOUNTER — Ambulatory Visit: Admission: RE | Admit: 2019-07-31 | Payer: No Typology Code available for payment source | Source: Ambulatory Visit

## 2019-08-26 DIAGNOSIS — Z79899 Other long term (current) drug therapy: Secondary | ICD-10-CM | POA: Insufficient documentation

## 2019-09-02 ENCOUNTER — Other Ambulatory Visit: Payer: Self-pay | Admitting: Family Medicine

## 2019-09-02 DIAGNOSIS — R32 Unspecified urinary incontinence: Secondary | ICD-10-CM

## 2019-09-03 ENCOUNTER — Encounter: Payer: Self-pay | Admitting: Urology

## 2019-09-03 ENCOUNTER — Other Ambulatory Visit: Payer: Self-pay

## 2019-09-03 ENCOUNTER — Ambulatory Visit: Payer: Medicare HMO | Admitting: Urology

## 2019-09-03 ENCOUNTER — Other Ambulatory Visit
Admission: RE | Admit: 2019-09-03 | Discharge: 2019-09-03 | Disposition: A | Discharge status: Home / Self Care | Payer: Medicare HMO | Attending: Urology | Admitting: Urology

## 2019-09-03 VITALS — BP 126/65 | HR 80 | Ht 63.0 in | Wt 212.0 lb

## 2019-09-03 DIAGNOSIS — R32 Unspecified urinary incontinence: Secondary | ICD-10-CM

## 2019-09-03 DIAGNOSIS — N3281 Overactive bladder: Secondary | ICD-10-CM | POA: Diagnosis not present

## 2019-09-03 DIAGNOSIS — N3941 Urge incontinence: Secondary | ICD-10-CM | POA: Diagnosis not present

## 2019-09-03 LAB — URINALYSIS, COMPLETE (UACMP) WITH MICROSCOPIC
Bacteria, UA: NONE SEEN
Bilirubin Urine: NEGATIVE
Glucose, UA: NEGATIVE mg/dL
Hgb urine dipstick: NEGATIVE
Ketones, ur: NEGATIVE mg/dL
Nitrite: NEGATIVE
Protein, ur: NEGATIVE mg/dL
Specific Gravity, Urine: 1.02 (ref 1.005–1.030)
pH: 5.5 (ref 5.0–8.0)

## 2019-09-03 LAB — BLADDER SCAN AMB NON-IMAGING

## 2019-09-03 NOTE — Progress Notes (Signed)
09/03/19 9:21 AM   [place] [birth year] TA:[id number]  CC: Incontinence  HPI: I saw Gabriela Pham in urology clinic today for evaluation of incontinence.  She is a comorbid 79 year old female with history of diabetes, breast cancer, recent hospitalization for confusion and gout followed by rehab, who presents with worsening urinary incontinence since December 2020.  She has been having to wear a pad during the day and at night, and when she wakes up in the morning the pad is "soaking wet."  She denies any stress incontinence.  She does have significant urgency and urge incontinence where she does not make it to the bathroom in time. She denies any history of recurrent urinary tract infections, gross hematuria, or flank pain.  She drinks a large bottle of diet Dr. Malachi Bonds every day, as well as unsweetened tea in the morning.  They have been doing timed voiding over the last week which has made a significant improvement in her urinary incontinence.  The majority of the history today is obtained from her daughter.  She does not have a smoking history.  Urinalysis today benign with 0-5 WBCs, 0-5 RBCs, no bacteria, nitrite negative.  PVR 0 mL.  Renal ultrasound 07/26/2019 at [facility] reportedly with no hydronephrosis, though I am unable to personally review these images.   PMH: Past Medical History:  Diagnosis Date  . Anemia   . Anxiety   . Arthritis   . Breast cancer ([place]) 2011   left-had chemo  . Cancer ([place]) 1973   cervical ca  . Cataract cortical, senile   . Cholelithiasis without obstruction   . Depression   . Diabetes mellitus without complication ([place])   . Diarrhea   . Dyspnea   . GERD (gastroesophageal reflux disease)   . Gout   . History of chicken pox   . History of colon polyps   . Hyperlipidemia   . Hypertension   . Personal history of chemotherapy     Surgical History: Past Surgical History:  Procedure Laterality Date  . ABDOMINAL HYSTERECTOMY    .  BUNIONECTOMY    . CHOLECYSTECTOMY    . COLONOSCOPY WITH PROPOFOL N/A 05/31/2019   Procedure: COLONOSCOPY WITH PROPOFOL;  Surgeon: Robert Bellow, MD;  Location: [facility] ENDOSCOPY;  Service: Endoscopy;  Laterality: N/A;  . HAMMER TOE SURGERY Left   . MASTECTOMY Left 2011   positve/had chemo  . REDUCTION MAMMAPLASTY Right 2012  . REVERSE SHOULDER ARTHROPLASTY Right 10/04/2016   Procedure: REVERSE SHOULDER ARTHROPLASTY;  Surgeon: Corky Mull, MD;  Location: [facility] ORS;  Service: Orthopedics;  Laterality: Right;    Family History: Family History  Problem Relation Age of Onset  . Breast cancer Neg Hx     Social History:  reports that she has never smoked. She has never used smokeless tobacco. She reports that she does not drink alcohol or use drugs.  Physical Exam: BP 126/65 (BP Location: Left Arm, Patient Position: Sitting, Cuff Size: Normal)   Pulse 80   Ht 5\' 3"  (1.6 m)   Wt 212 lb (96.2 kg)   BMI 37.55 kg/m    Constitutional:  Alert and oriented, No acute distress. Cardiovascular: No clubbing, cyanosis, or edema. Respiratory: Normal respiratory effort, no increased work of breathing. GU: No CVA tenderness Neurologic: Grossly intact, no focal deficits, moving all 4 extremities. Psychiatric: Normal mood and affect.  Laboratory Data: Reviewed, see HPI  Assessment & Plan:   In summary, she is a co-morbid 79 year old female with 3 months  of urge incontinence that have improved significantly over the last week with timed voiding.  We discussed that overactive bladder (OAB) is not a disease, but is a symptom complex that is generally not life-threatening.  Symptoms typically include urinary urgency, frequency, and urge incontinence.  There are numerous treatment options, however there are risks and benefits with both medical and surgical management.  First-line treatment is behavioral therapies including bladder training, pelvic floor muscle training, and fluid management.  Second  line treatments include oral antimuscarinics(Ditropan er, Trospium) and beta-3 agonist (Mybetriq). There is typically a period of medication trial (4-8 weeks) to find the optimal therapy and dosing. If symptoms are bothersome despite the above management, third line options include intra-detrusor botox, peripheral tibial nerve stimulation (PTNS), and interstim (SNS). These are more invasive treatments with higher side effect profile, but may improve quality of life for patients with severe OAB symptoms.    -Continue timed voiding, behavioral strategies, minimize diet sodas -They would like to hold off on any medications at this time which is very reasonable.  Would consider Myrbetriq in the future if worsening symptoms despite behavioral strategies.  Avoid anticholinergics with her age, frailty, fall risk, and history of confusion -Reassurance provided regarding benign urinalysis and PVR of 0 mL   I spent 45 total minutes on the day of the encounter including pre-visit review of the medical record, face-to-face time with the patient, and post visit ordering of labs/imaging/tests.  Nickolas Madrid, MD 09/03/2019  [facility] [address] [place], [place] [address] [phone]

## 2019-09-03 NOTE — Patient Instructions (Signed)
1. Minimize fluid intake after 7pm, urinate twice right before bed. Continue timed voiding every 2-3 hours. 2. Avoid diet sodas and tea   Overactive Bladder, Adult  Overactive bladder refers to a condition in which a person has a sudden need to pass urine. The person may leak urine if he or she cannot get to the bathroom fast enough (urinary incontinence). A person with this condition may also wake up several times in the night to go to the bathroom. Overactive bladder is associated with poor nerve signals between your bladder and your brain. Your bladder may get the signal to empty before it is full. You may also have very sensitive muscles that make your bladder squeeze too soon. These symptoms might interfere with daily work or social activities. What are the causes? This condition may be associated with or caused by:  Urinary tract infection.  Infection of nearby tissues, such as the prostate.  Prostate enlargement.  Surgery on the uterus or urethra.  Bladder stones, inflammation, or tumors.  Drinking too much caffeine or alcohol.  Certain medicines, especially medicines that get rid of extra fluid in the body (diuretics).  Muscle or nerve weakness, especially from: ? A spinal cord injury. ? Stroke. ? Multiple sclerosis. ? Parkinson's disease.  Diabetes.  Constipation. What increases the risk? You may be at greater risk for overactive bladder if you:  Are an older adult.  Smoke.  Are going through menopause.  Have prostate problems.  Have a neurological disease, such as stroke, dementia, Parkinson's disease, or multiple sclerosis (MS).  Eat or drink things that irritate the bladder. These include alcohol, spicy food, and caffeine.  Are overweight or obese. What are the signs or symptoms? Symptoms of this condition include:  Sudden, strong urge to urinate.  Leaking urine.  Urinating 8 or more times a day.  Waking up to urinate 2 or more times a  night. How is this diagnosed? Your health care provider may suspect overactive bladder based on your symptoms. He or she will diagnose this condition by:  A physical exam and medical history.  Blood or urine tests. You might need bladder or urine tests to help determine what is causing your overactive bladder. You might also need to see a health care provider who specializes in urinary tract problems (urologist). How is this treated? Treatment for overactive bladder depends on the cause of your condition and whether it is mild or severe. You can also make lifestyle changes at home. Options include:  Bladder training. This may include: ? Learning to control the urge to urinate by following a schedule that directs you to urinate at regular intervals (timed voiding). ? Doing Kegel exercises to strengthen your pelvic floor muscles, which support your bladder. Toning these muscles can help you control urination, even if your bladder muscles are overactive.  Special devices. This may include: ? Biofeedback, which uses sensors to help you become aware of your body's signals. ? Electrical stimulation, which uses electrodes placed inside the body (implanted) or outside the body. These electrodes send gentle pulses of electricity to strengthen the nerves or muscles that control the bladder. ? Women may use a plastic device that fits into the vagina and supports the bladder (pessary).  Medicines. ? Antibiotics to treat bladder infection. ? Antispasmodics to stop the bladder from releasing urine at the wrong time. ? Tricyclic antidepressants to relax bladder muscles. ? Injections of botulinum toxin type A directly into the bladder tissue to relax bladder muscles.  Lifestyle changes. This may include: ? Weight loss. Talk to your health care provider about weight loss methods that would work best for you. ? Diet changes. This may include reducing how much alcohol and caffeine you consume, or drinking  fluids at different times of the day. ? Not smoking. Do not use any products that contain nicotine or tobacco, such as cigarettes and e-cigarettes. If you need help quitting, ask your health care provider.  Surgery. ? A device may be implanted to help manage the nerve signals that control urination. ? An electrode may be implanted to stimulate electrical signals in the bladder. ? A procedure may be done to change the shape of the bladder. This is done only in very severe cases. Follow these instructions at home: Lifestyle  Make any diet or lifestyle changes that are recommended by your health care provider. These may include: ? Drinking less fluid or drinking fluids at different times of the day. ? Cutting down on caffeine or alcohol. ? Doing Kegel exercises. ? Losing weight if needed. ? Eating a healthy and balanced diet to prevent constipation. This may include:  Eating foods that are high in fiber, such as fresh fruits and vegetables, whole grains, and beans.  Limiting foods that are high in fat and processed sugars, such as fried and sweet foods. General instructions  Take over-the-counter and prescription medicines only as told by your health care provider.  If you were prescribed an antibiotic medicine, take it as told by your health care provider. Do not stop taking the antibiotic even if you start to feel better.  Use any implants or pessary as told by your health care provider.  If needed, wear pads to absorb urine leakage.  Keep a journal or log to track how much and when you drink and when you feel the need to urinate. This will help your health care provider monitor your condition.  Keep all follow-up visits as told by your health care provider. This is important. Contact a health care provider if:  You have a fever.  Your symptoms do not get better with treatment.  Your pain and discomfort get worse.  You have more frequent urges to urinate. Get help right away  if:  You are not able to control your bladder. Summary  Overactive bladder refers to a condition in which a person has a sudden need to pass urine.  Several conditions may lead to an overactive bladder.  Treatment for overactive bladder depends on the cause and severity of your condition.  Follow your health care provider's instructions about lifestyle changes, doing Kegel exercises, keeping a journal, and taking medicines. This information is not intended to replace advice given to you by your health care provider. Make sure you discuss any questions you have with your health care provider. Document Revised: 10/25/2018 Document Reviewed: 07/20/2017 Elsevier Patient Education  [place].

## 2021-10-02 IMAGING — CT CT HEAD W/O CM
3 series · 15 of 46 positions shown, 18 images · non-contrast
Comparison: None.

CLINICAL DATA: 78-year-old female with dizziness and ataxia

EXAM:
CT HEAD WITHOUT CONTRAST
TECHNIQUE: Contiguous axial images were obtained from the base of the skull
through the vertex without intravenous contrast.

[Series 2: head wo · axial · 0.42mm/px · z∈[-131,-11]mm · 9 of 29 slices shown, 12 images]
[im 3/29  brain]
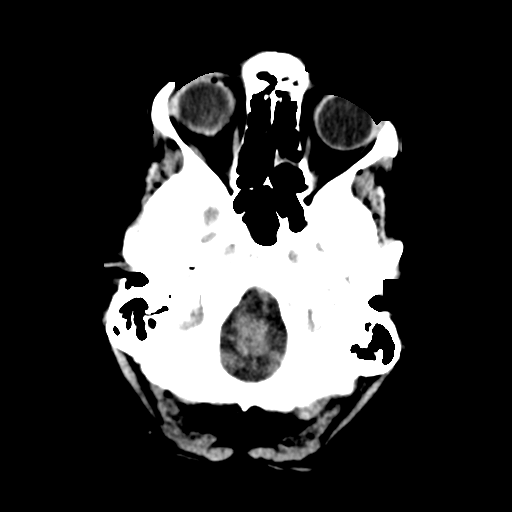
[im 3/29  bone]
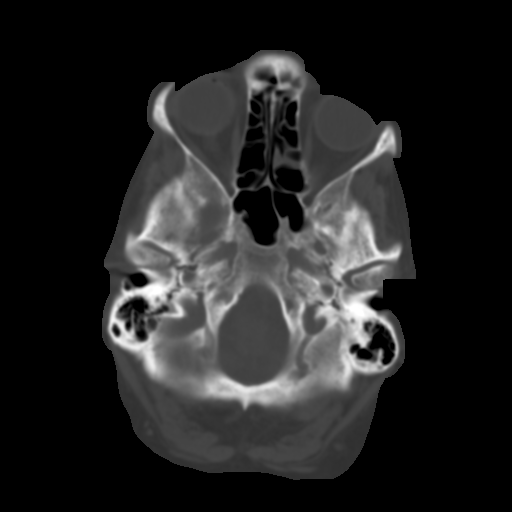
[im 6/29  brain]
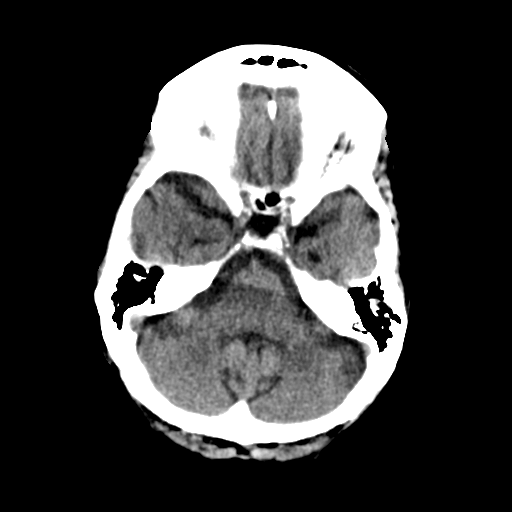
[im 9/29  brain]
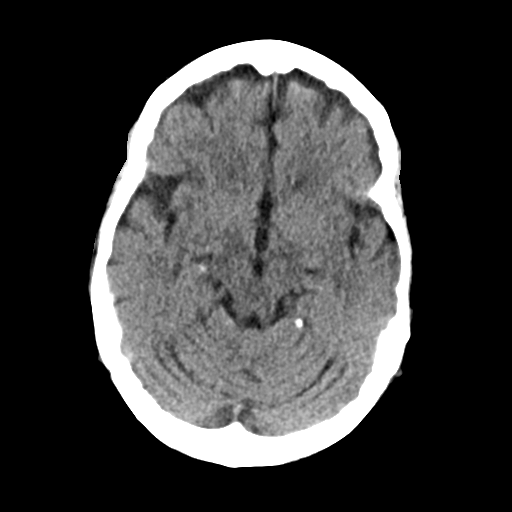
[im 12/29  brain]
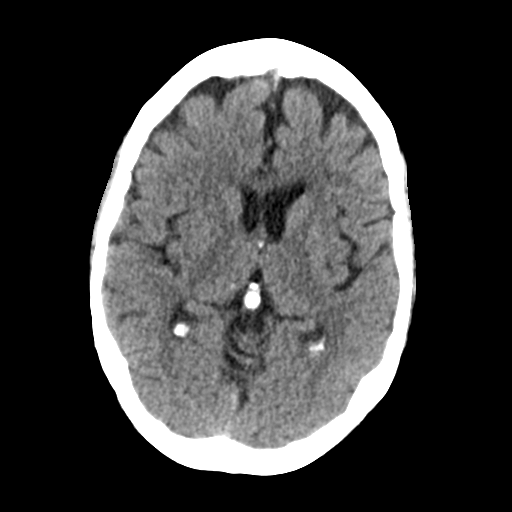
[im 15/29  brain]
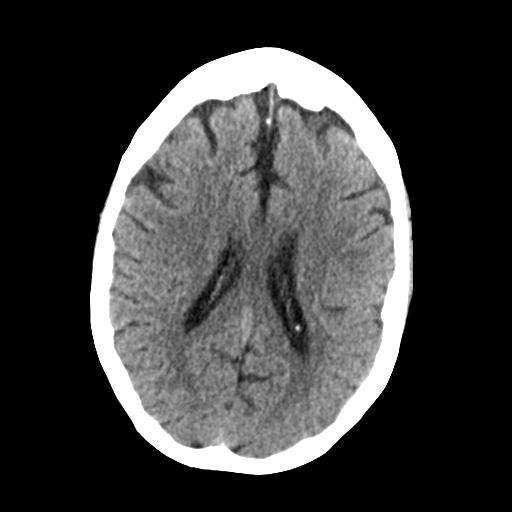
[im 15/29  bone]
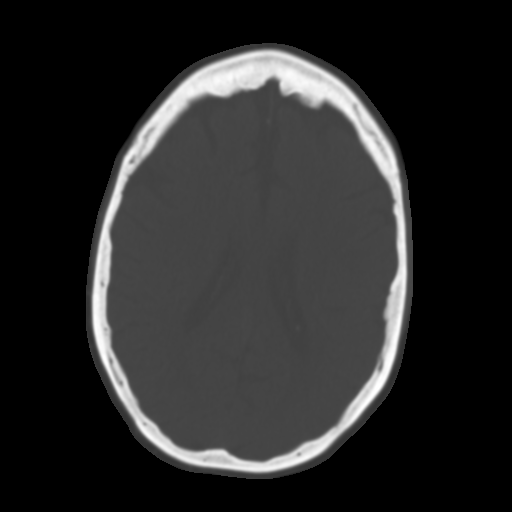
[im 18/29  brain]
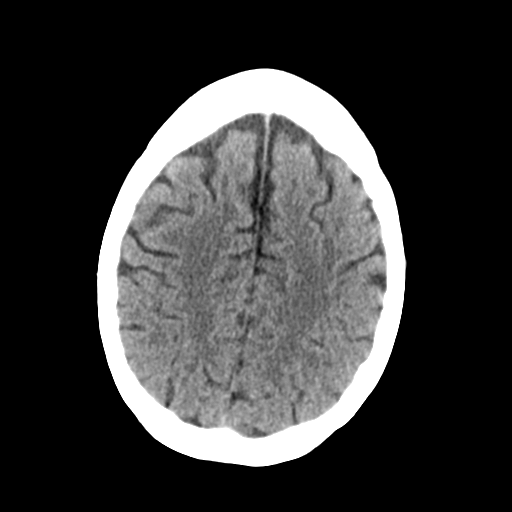
[im 21/29  brain]
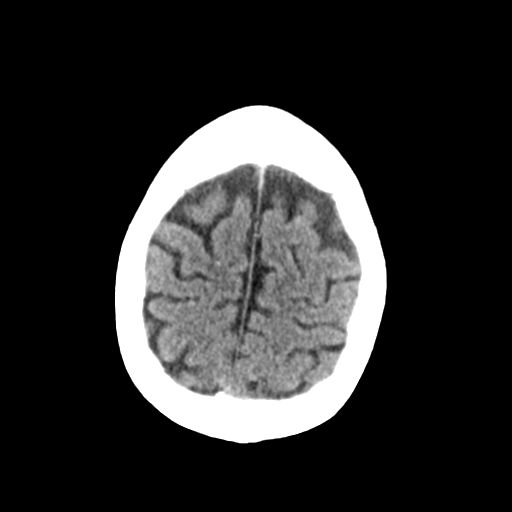
[im 24/29  brain]
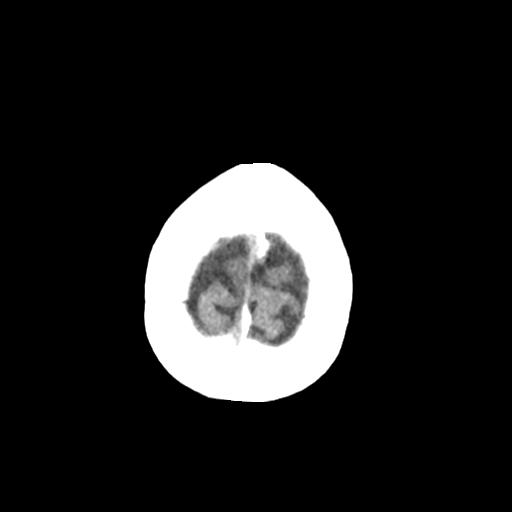
[im 27/29  brain]
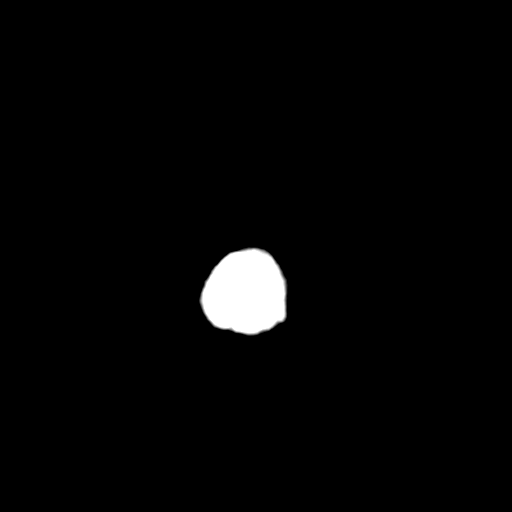
[im 27/29  bone]
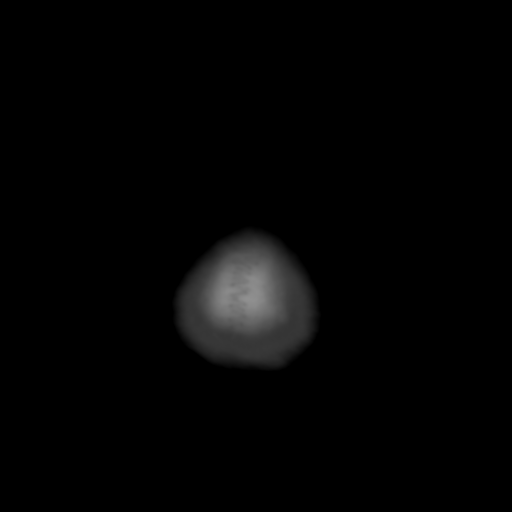

[Series 4: coronal soft tissue · coronal · 0.28mm/px · 3 of 66 slices shown]
[im 22/66  brain]
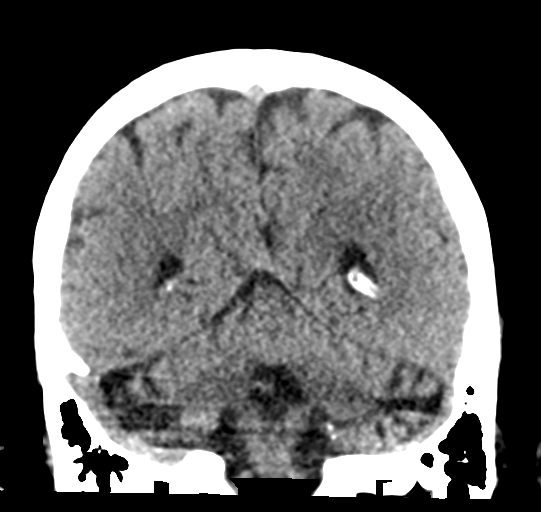
[im 29/66  brain]
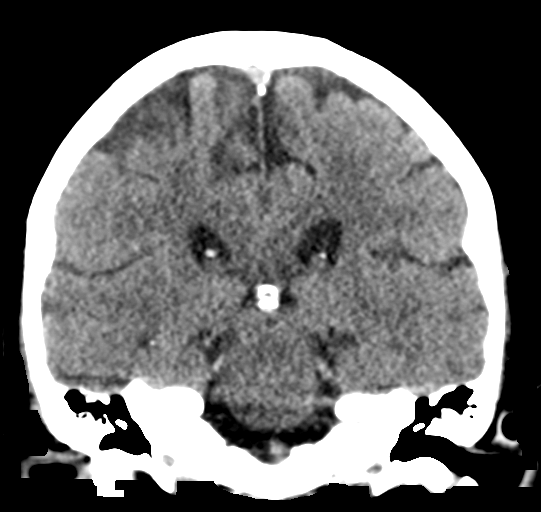
[im 37/66  brain]
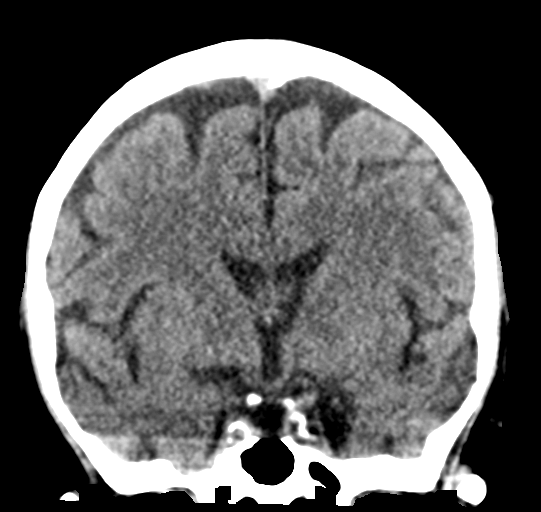

[Series 5: sagittal soft tissue · sagittal · 0.28mm/px · 3 of 51 slices shown]
[im 17/51  brain]
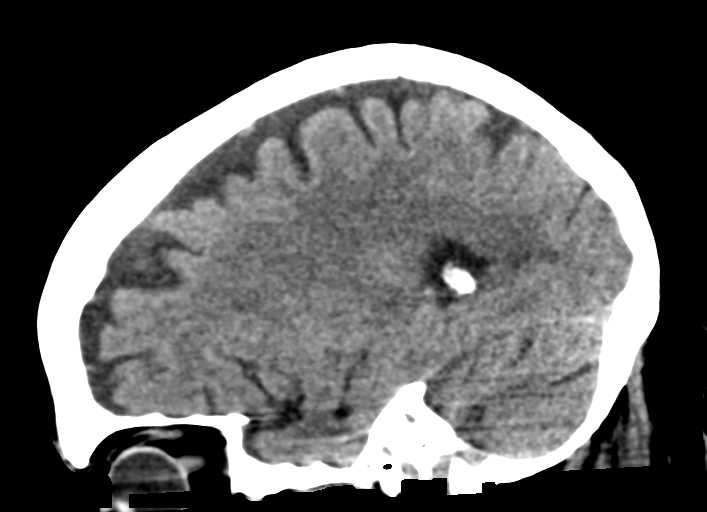
[im 26/51  brain]
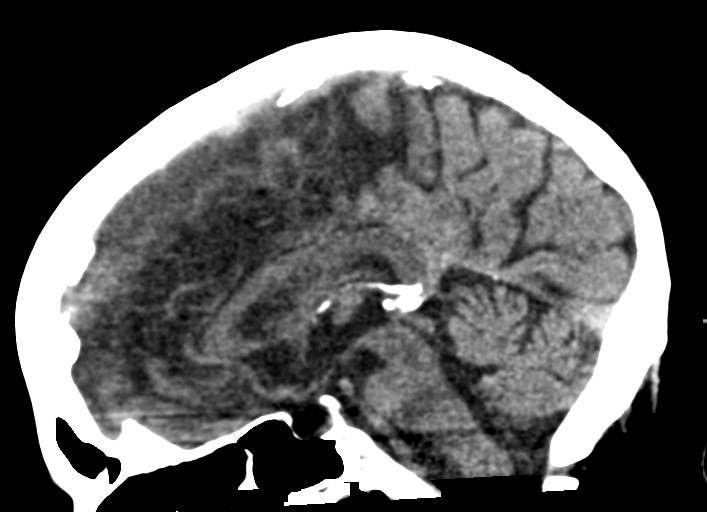
[im 34/51  brain]
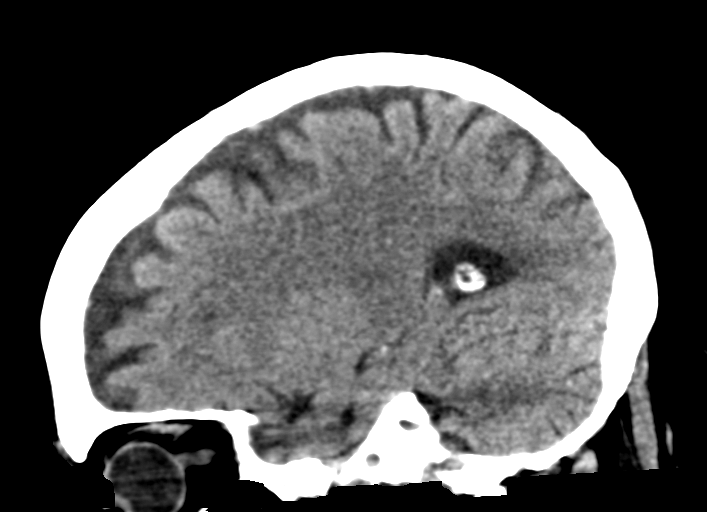

[15 of 46 positions shown; findings below may reference images not displayed]

FINDINGS: Brain: No evidence of acute infarction, hemorrhage, hydrocephalus,
extra-axial collection or mass lesion/mass effect. Cerebral cortical
volume loss commensurate with age.

Vascular: No hyperdense vessel or unexpected calcification.

Skull: Normal. Negative for fracture or focal lesion.

Sinuses/Orbits: No acute finding.

Other: None.
IMPRESSION: 1. No acute intracranial abnormality.
2. Cerebral cortical atrophy commensurate with age.

## 2021-11-19 IMAGING — US US CAROTID DUPLEX BILAT
1 series · 13 of 24 positions shown · non-contrast
Comparison: None.

CLINICAL DATA: Altered mental status. Syncopal episode. History of
hypertension, hyperlipidemia and diabetes.

EXAM:
BILATERAL CAROTID DUPLEX ULTRASOUND
TECHNIQUE: Gray scale imaging, color Doppler and duplex ultrasound were
performed of bilateral carotid and vertebral arteries in the neck.

[Series 1: us carotid duplex bilat · 13 of 67 slices shown]
[im 1/67]
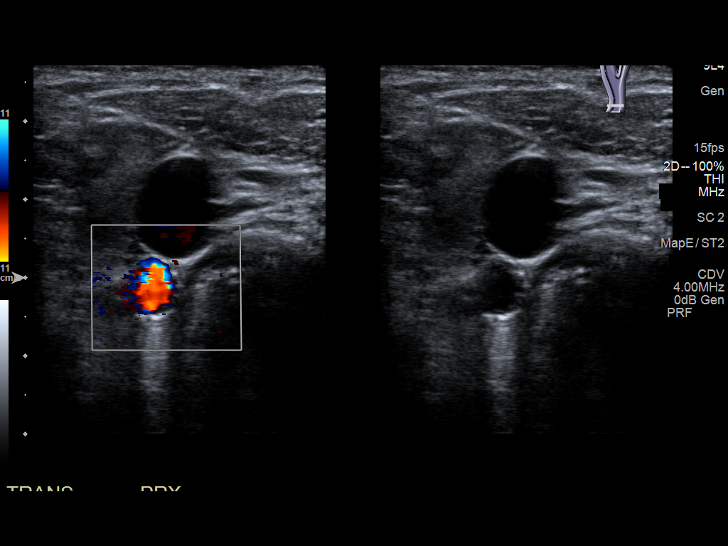
[im 6/67]
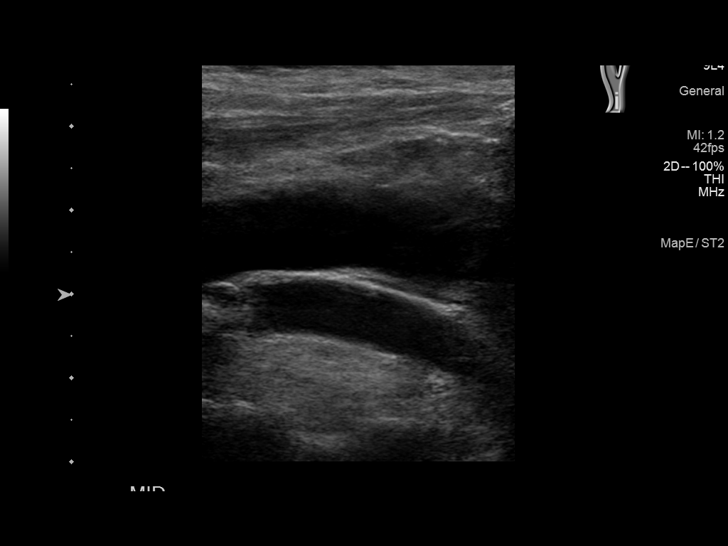
[im 12/67]
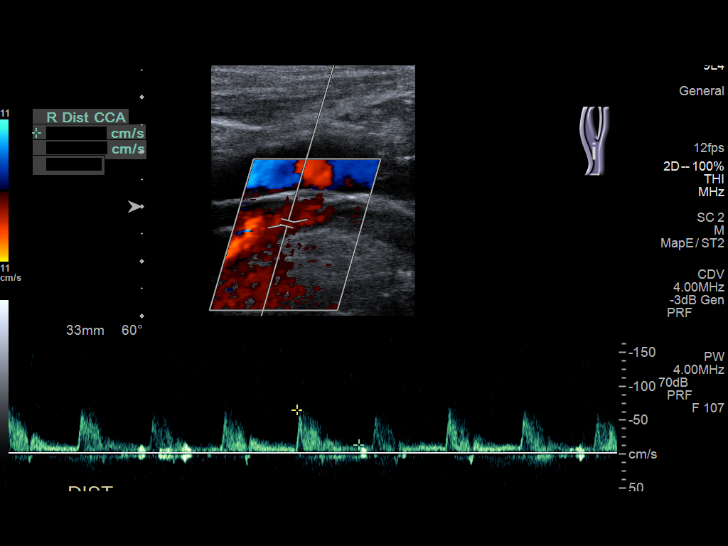
[im 18/67]
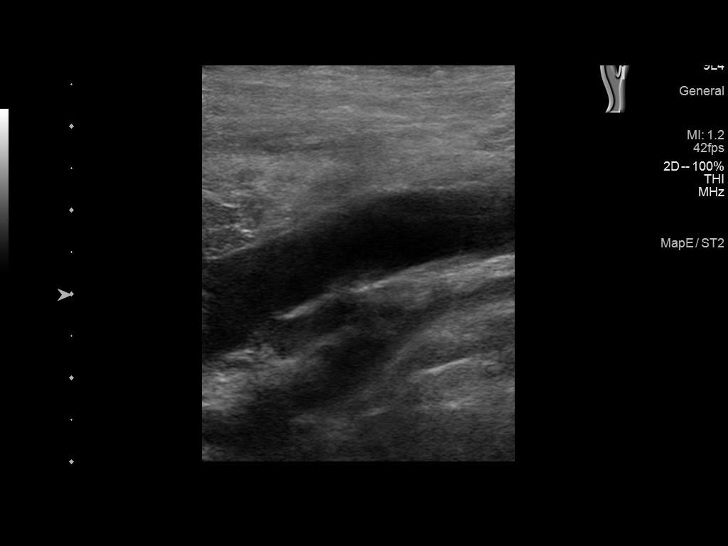
[im 23/67]
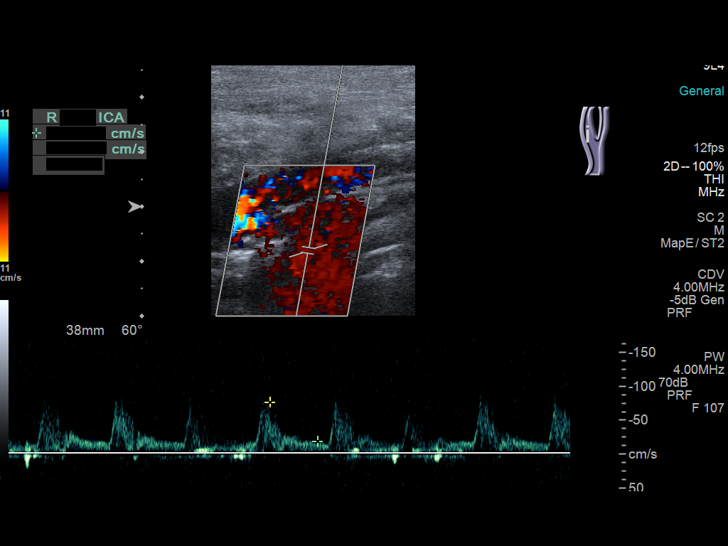
[im 29/67]
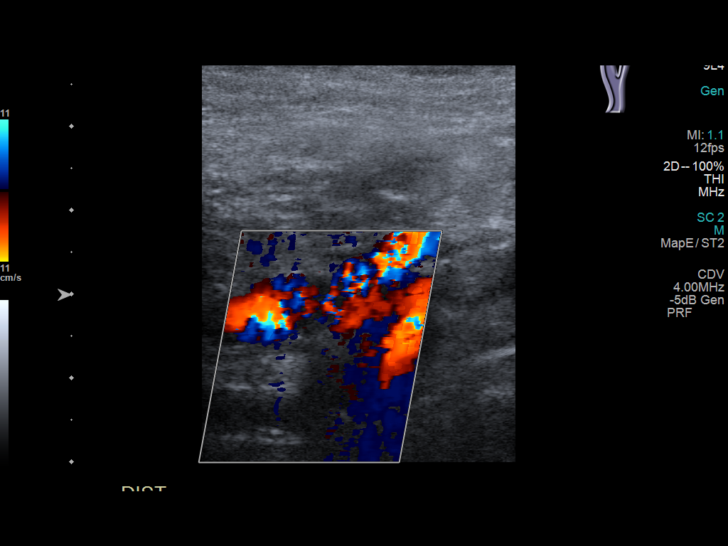
[im 35/67]
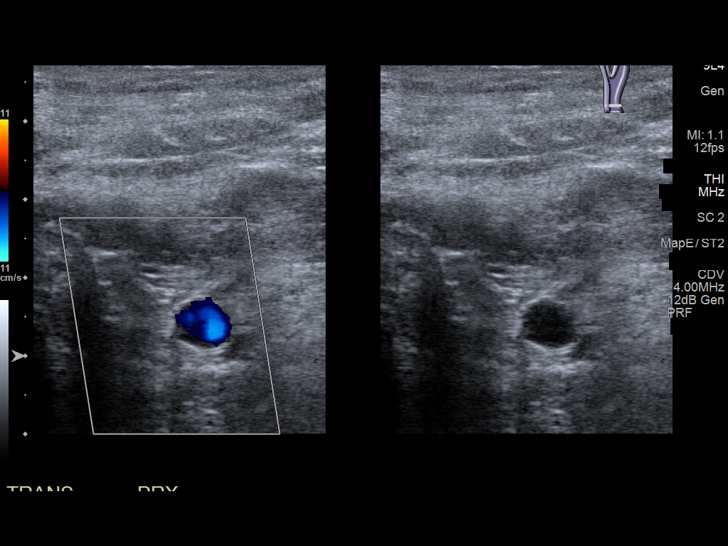
[im 38/67]
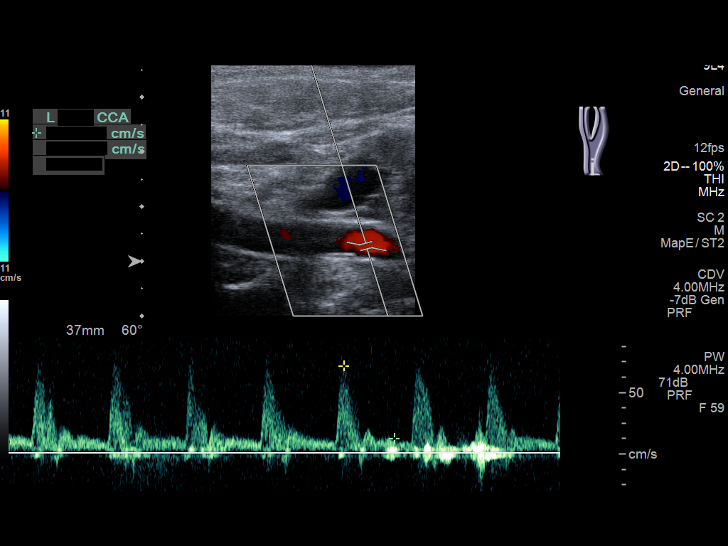
[im 44/67]
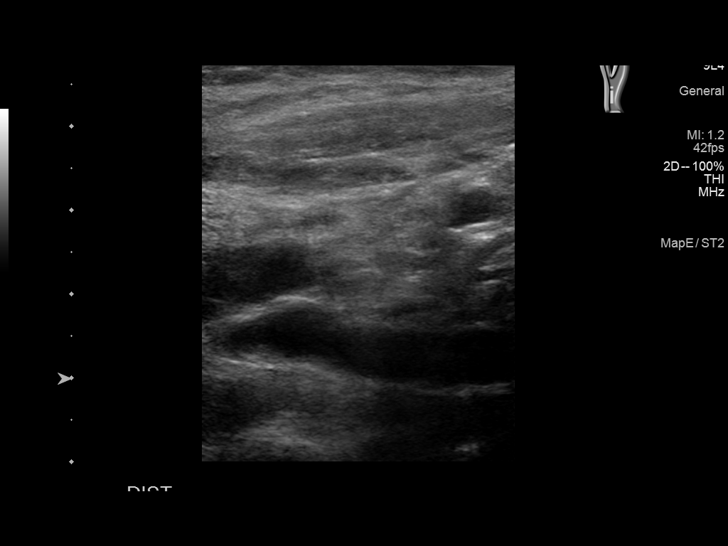
[im 49/67]
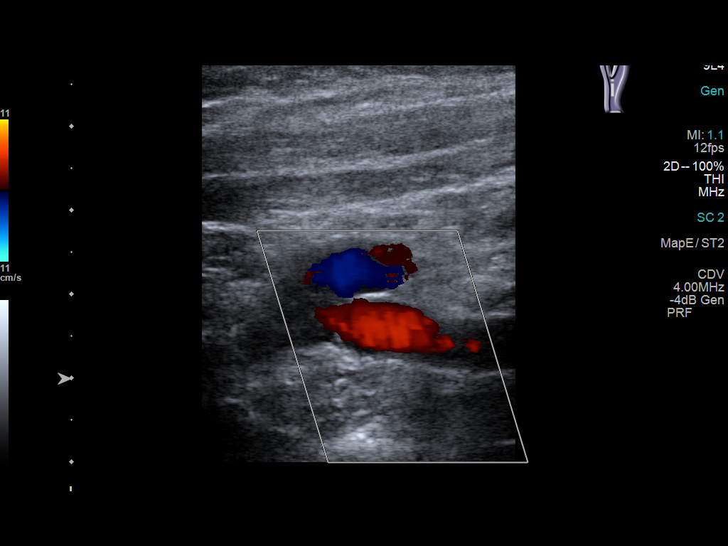
[im 55/67]
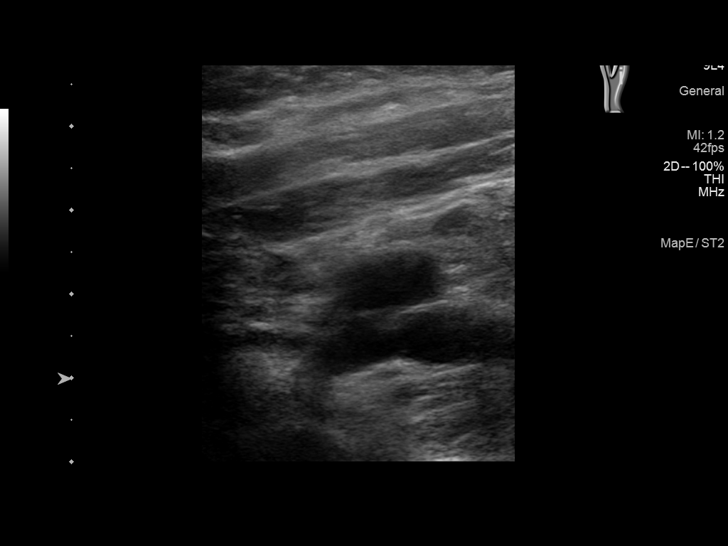
[im 61/67]
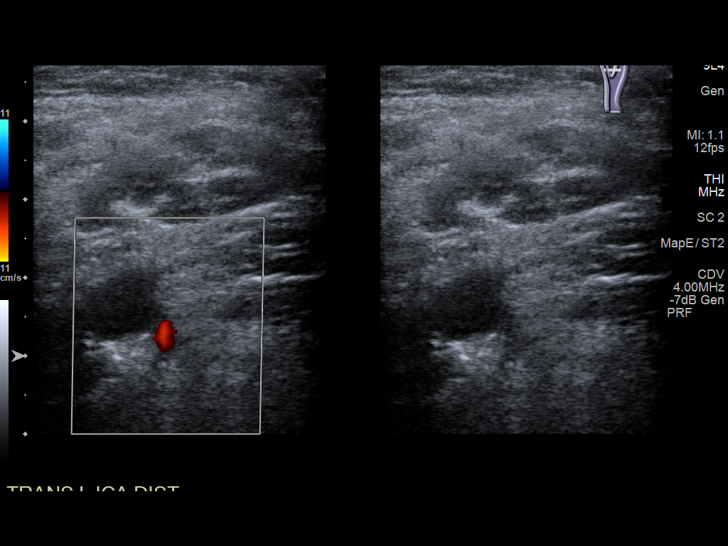
[im 67/67]
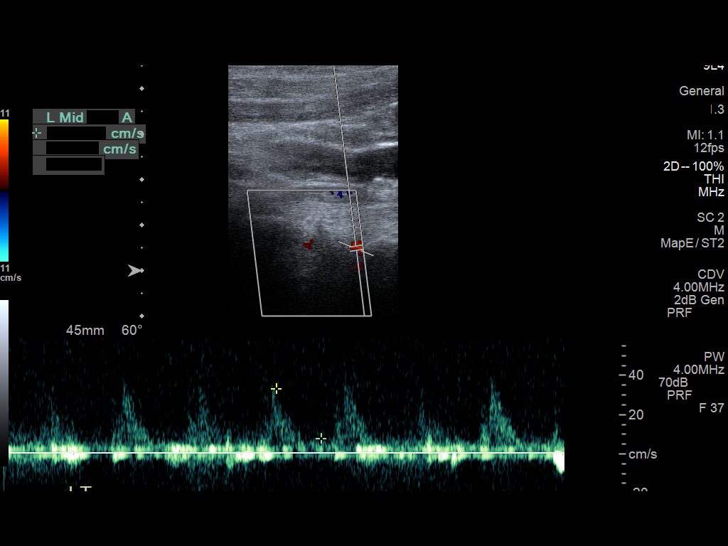

[13 of 24 positions shown; findings below may reference images not displayed]

FINDINGS: Examination is degraded due to patient body habitus and poor
sonographic window.

Criteria: Quantification of carotid stenosis is based on velocity
parameters that correlate the residual internal carotid diameter
with NASCET-based stenosis levels, using the diameter of the distal
internal carotid lumen as the denominator for stenosis measurement.

The following velocity measurements were obtained:

RIGHT

ICA: 77/19 cm/sec

CCA: 91/18 cm/sec

SYSTOLIC ICA/CCA RATIO:

ECA: 88 cm/sec

LEFT

ICA: 80/18 cm/sec

CCA: 57/14 cm/sec

SYSTOLIC ICA/CCA RATIO:

ECA: 93 cm/sec

RIGHT CAROTID ARTERY: There is no grayscale evidence of significant
intimal thickening or atherosclerotic plaque affecting the
interrogated portions of the right carotid system. There are no
elevated peak systolic velocities within the interrogated course of
the right internal carotid artery to suggest a hemodynamically
significant stenosis.

RIGHT VERTEBRAL ARTERY:  Antegrade flow

LEFT CAROTID ARTERY: There is no grayscale evidence of significant
intimal thickening or atherosclerotic plaque affecting the
interrogated portions of the left carotid system. There are no
elevated peak systolic velocities within the interrogated course of
the left internal artery to suggest a hemodynamically significant
stenosis.

LEFT VERTEBRAL ARTERY:  Antegrade flow
IMPRESSION: No evidence of a hemodynamically significant stenosis affecting
either internal carotid artery on this body habitus degraded
examination.

## 2023-06-02 DIAGNOSIS — K219 Gastro-esophageal reflux disease without esophagitis: Secondary | ICD-10-CM | POA: Diagnosis not present

## 2023-06-02 DIAGNOSIS — Z79899 Other long term (current) drug therapy: Secondary | ICD-10-CM | POA: Diagnosis not present

## 2023-06-02 DIAGNOSIS — G3184 Mild cognitive impairment, so stated: Secondary | ICD-10-CM | POA: Diagnosis not present

## 2023-06-02 DIAGNOSIS — E7800 Pure hypercholesterolemia, unspecified: Secondary | ICD-10-CM | POA: Diagnosis not present

## 2023-06-02 DIAGNOSIS — E538 Deficiency of other specified B group vitamins: Secondary | ICD-10-CM | POA: Diagnosis not present

## 2023-06-02 DIAGNOSIS — M1A09X0 Idiopathic chronic gout, multiple sites, without tophus (tophi): Secondary | ICD-10-CM | POA: Diagnosis not present

## 2023-06-02 DIAGNOSIS — E119 Type 2 diabetes mellitus without complications: Secondary | ICD-10-CM | POA: Diagnosis not present

## 2023-06-02 DIAGNOSIS — F418 Other specified anxiety disorders: Secondary | ICD-10-CM | POA: Diagnosis not present

## 2023-06-02 DIAGNOSIS — I10 Essential (primary) hypertension: Secondary | ICD-10-CM | POA: Diagnosis not present

## 2023-09-11 ENCOUNTER — Ambulatory Visit: Payer: Medicare Other | Attending: Gerontology | Admitting: Physical Therapy

## 2023-09-11 ENCOUNTER — Encounter: Payer: Self-pay | Admitting: Physical Therapy

## 2023-09-11 DIAGNOSIS — R278 Other lack of coordination: Secondary | ICD-10-CM

## 2023-09-11 DIAGNOSIS — R293 Abnormal posture: Secondary | ICD-10-CM | POA: Diagnosis present

## 2023-09-11 DIAGNOSIS — R2689 Other abnormalities of gait and mobility: Secondary | ICD-10-CM

## 2023-09-11 DIAGNOSIS — M79671 Pain in right foot: Secondary | ICD-10-CM | POA: Diagnosis present

## 2023-09-11 NOTE — Patient Instructions (Signed)
 Avoid straining pelvic floor, abdominal muscles , spine  Use log rolling technique instead of getting out of bed with your neck or the sit-up    Proper body mechanics with getting out of a chair to decrease strain  on back &pelvic floor   Avoid holding your breath when Getting out of the chair:  Scoot to front part of chair chair Heels behind knees, feet are hip width apart, nose over toes  Inhale like you are smelling roses Exhale to stand   __  Sitting with feet on ground, four points of contact Catch yourself crossing ankles and thighs  __

## 2023-09-11 NOTE — Therapy (Unsigned)
 OUTPATIENT PHYSICAL THERAPY EVALUATION   Patient Name: Gabriela Pham MRN: [id number] DOB:07/11/1941, 83 y.o., female Today's Date: 09/11/2023  END OF SESSION:  PT End of Session - 09/11/23 1103     Visit Number 1    Number of Visits 10    Date for PT Re-Evaluation 11/20/23    PT Start Time 1105    PT Stop Time 1145    PT Time Calculation (min) 40 min    Activity Tolerance Patient tolerated treatment well;No increased pain    Behavior During Therapy WFL for tasks assessed/performed             Past Medical History:  Diagnosis Date   Anemia    Anxiety    Arthritis    Breast cancer ([facility]) 2011   left-had chemo   Cancer ([facility]) 1973   cervical ca   Cataract cortical, senile    Cholelithiasis without obstruction    Depression    Diabetes mellitus without complication ([facility])    Diarrhea    Dyspnea    GERD (gastroesophageal reflux disease)    Gout    History of chicken pox    History of colon polyps    Hyperlipidemia    Hypertension    Personal history of chemotherapy    Past Surgical History:  Procedure Laterality Date   ABDOMINAL HYSTERECTOMY     BUNIONECTOMY Bilateral    CHOLECYSTECTOMY     COLONOSCOPY WITH PROPOFOL N/A 05/31/2019   Procedure: COLONOSCOPY WITH PROPOFOL;  Surgeon: Earline Mayotte, MD;  Location: [facility] ENDOSCOPY;  Service: Endoscopy;  Laterality: N/A;   HAMMER TOE SURGERY Left    MASTECTOMY Left 2011   positve/had chemo   REDUCTION MAMMAPLASTY Right 2012   REVERSE SHOULDER ARTHROPLASTY Right 10/04/2016   Procedure: REVERSE SHOULDER ARTHROPLASTY;  Surgeon: Christena Flake, MD;  Location: [facility] ORS;  Service: Orthopedics;  Laterality: Right;   Patient Active Problem List   Diagnosis Date Noted   Encounter for long-term (current) use of high-risk medication 08/26/2019   AMS (altered mental status) 07/08/2019   Hypertension    Diabetes mellitus without complication ([facility])    Morbid obesity ([facility])    Acute encephalopathy    Diarrhea     Ataxia 06/08/2019   Gout 06/07/2019   Vertigo 04/17/2019   Generalized weakness 01/01/2019   Vitamin D deficiency 08/21/2018   Chronic gout of multiple sites 08/08/2018   GERD (gastroesophageal reflux disease) 09/13/2017   Chronic diarrhea 03/06/2017   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   Obesity (BMI 30-39.9) 12/31/2015   Depression with anxiety 07/01/2015   Chronic right shoulder pain 08/28/2014   Bilateral hearing loss 06/20/2014   Primary osteoarthritis of both knees 03/13/2014   Asymptomatic varicose veins of both lower extremities 10/29/2013   History of cervical cancer 10/29/2013   Hyperlipidemia 10/29/2013    PCP: Laren Everts , NP   REFERRING PROVIDER:  Laren Everts , NP   REFERRING DIAG: N39.41 (ICD-10-CM) - Urge incontinence   Rationale for Evaluation and Treatment: Rehabilitation  THERAPY DIAG:  Other abnormalities of gait and mobility  Abnormal posture  Other lack of coordination  ONSET DATE:   SUBJECTIVE:  SUBJECTIVE STATEMENT ON EVAL 09/11/23 :  Son , Gabriela Pham is primary caregiver   1) urinary leakage:  wears brief/ pads per month. Son reports it is better and bed sheet pads were no longer soaked and the smell is not present anymore.  Son reports she was not going up to go to the bathroom. Son reports she is getting better and going to the bathroom. Son is helping pt take medication at 9pm and not right before bed .  Son is encouraging pt to drink water.    2) R foot pain: 4/10 pain with walking from parking lot to clinic .  R foot ulcer that required son and dtr in law to apply medication and bandage to heel. Podiatrist has confirmed it is a compression ulcer and recommended wider toe box shoes.  Pt wilth B bunionectomy and L foot surgery.        PERTINENT HISTORY:   Dementia Stage III-IV   Abdominal hysterectomy    PAIN:  Are you having pain?   PRECAUTIONS:    WEIGHT BEARING RESTRICTIONS:    FALLS:  Has patient fallen in last 6 months? On 07/12/23:  son reported pt was walking up stairs without railing and she felt backward into the grass,.  Pt was assisted to get back up .  Oct 2024, pt was sitting on EOB and fell off her bed.    LIVING ENVIRONMENT: Lives with: son and dtr-in law  Lives in: one story  Stairs: STE 3 with rail  Has following equipment at home:  cane with 4 prong   OCCUPATION:  Works 3 a week at [employer]    PLOF:   Requires  assistance due to dementia , stage III-IV     PATIENT GOALS:        OBJECTIVE:      HOME EXERCISE PROGRAM: See pt instruction section    ASSESSMENT:  CLINICAL IMPRESSION: **   Pt benefits from skilled PT.    OBJECTIVE IMPAIRMENTS decreased activity tolerance, decreased coordination, decreased endurance, decreased mobility, difficulty walking, decreased ROM, decreased strength, decreased safety awareness, hypomobility, increased muscle spasms, impaired flexibility, improper body mechanics, postural dysfunction, and pain. scar restrictions   ACTIVITY LIMITATIONS  self-care,  sleep, home chores, work tasks    PARTICIPATION LIMITATIONS:  community, activities    PERSONAL FACTORS   ***    are also affecting patient's functional outcome.    REHAB POTENTIAL: Good   CLINICAL DECISION MAKING: Evolving/moderate complexity   EVALUATION COMPLEXITY: Moderate    PATIENT EDUCATION:    Education details: Showed pt anatomy images. Explained muscles attachments/ connection, physiology of deep core system/ spinal- thoracic-pelvis-lower kinetic chain as they relate to pt's presentation, Sx, and past Hx. Explained what and how these areas of deficits need to be restored to balance and function    See Therapeutic activity / neuromuscular re-education  section  Answered pt's questions.   Person educated: Patient Education method: Explanation, Demonstration, Tactile cues, Verbal cues, and Handouts Education comprehension: verbalized understanding, returned demonstration, verbal cues required, tactile cues required, and needs further education     PLAN: PT FREQUENCY: 1x/week   PT DURATION: 10 weeks   PLANNED INTERVENTIONS: Therapeutic exercises, Therapeutic activity, Neuromuscular re-education, Balance training, Gait training, Patient/Family education, Self Care, Joint mobilization, Spinal mobilization, Moist heat, Taping, and Manual therapy, dry needling.   PLAN FOR NEXT SESSION: See clinical impression for plan     GOALS: Goals reviewed with patient? Yes  SHORT TERM GOALS: Target date:  10/09/2023    Pt will demo IND with HEP                    Baseline: Not IND            Goal status: INITIAL   LONG TERM GOALS: Target date: 11/20/2023    1.Pt will demo proper deep core coordination without chest breathing and optimal excursion of diaphragm/pelvic floor in order to promote spinal stability and pelvic floor function  Baseline: dyscoordination Goal status: INITIAL  2.  Pt will demo proper body mechanics in against gravity tasks and ADLs  work tasks, fitness  to minimize straining pelvic floor / back    Baseline: not IND, improper form that places strain on pelvic floor  Goal status: INITIAL    3. Pt will demo increased gait speed > 1.3 m/s with reciprocal gait pattern, longer stride length  in order to ambulate safely in community and return to fitness routine  Baseline:  Goal status: INITIAL    4.  Baseline:  Goal status: INITIAL   5.  Baseline:  Goal status: INITIAL    Mariane Masters, PT 09/11/2023, 11:55 AM

## 2023-09-18 ENCOUNTER — Ambulatory Visit: Payer: Medicare Other | Admitting: Physical Therapy

## 2023-09-27 ENCOUNTER — Encounter: Payer: Medicare HMO | Admitting: Physical Therapy

## 2023-10-02 ENCOUNTER — Encounter: Payer: Medicare HMO | Admitting: Physical Therapy

## 2023-10-09 ENCOUNTER — Encounter: Payer: Medicare HMO | Admitting: Physical Therapy

## 2023-10-16 ENCOUNTER — Encounter: Payer: Medicare HMO | Admitting: Physical Therapy

## 2023-10-23 ENCOUNTER — Ambulatory Visit: Payer: Medicare HMO | Admitting: Physical Therapy

## 2023-10-30 ENCOUNTER — Encounter: Payer: Medicare HMO | Admitting: Physical Therapy

## 2023-11-06 ENCOUNTER — Encounter: Payer: Medicare HMO | Admitting: Physical Therapy

## 2023-11-08 ENCOUNTER — Encounter: Payer: Medicare HMO | Admitting: Physical Therapy

## 2023-11-13 ENCOUNTER — Encounter: Payer: Medicare HMO | Admitting: Physical Therapy

## 2023-11-20 ENCOUNTER — Encounter: Payer: Medicare HMO | Admitting: Physical Therapy

## 2023-11-27 ENCOUNTER — Ambulatory Visit: Payer: Medicare HMO | Attending: Gerontology | Admitting: Physical Therapy

## 2023-11-27 DIAGNOSIS — R2689 Other abnormalities of gait and mobility: Secondary | ICD-10-CM | POA: Diagnosis present

## 2023-11-27 DIAGNOSIS — R278 Other lack of coordination: Secondary | ICD-10-CM | POA: Insufficient documentation

## 2023-11-27 DIAGNOSIS — M79671 Pain in right foot: Secondary | ICD-10-CM | POA: Diagnosis present

## 2023-11-27 DIAGNOSIS — R293 Abnormal posture: Secondary | ICD-10-CM | POA: Insufficient documentation

## 2023-11-27 NOTE — Therapy (Unsigned)
 OUTPATIENT PHYSICAL THERAPY TREATMENT / RECERT    Patient Name: Gabriela Pham MRN: [id number] DOB:02-Feb-1941, 83 y.o., female Today's Date: 11/27/2023  END OF SESSION:  PT End of Session - 11/27/23 1026     Visit Number 2    Number of Visits 10    Date for PT Re-Evaluation 02/05/24    PT Start Time 1020    PT Stop Time 1105    PT Time Calculation (min) 45 min    Activity Tolerance Patient tolerated treatment well;No increased pain    Behavior During Therapy WFL for tasks assessed/performed             Past Medical History:  Diagnosis Date   Anemia    Anxiety    Arthritis    Breast cancer (HCC) 2011   left-had chemo   Cancer (HCC) 1973   cervical ca   Cataract cortical, senile    Cholelithiasis without obstruction    Depression    Diabetes mellitus without complication (HCC)    Diarrhea    Dyspnea    GERD (gastroesophageal reflux disease)    Gout    History of chicken pox    History of colon polyps    Hyperlipidemia    Hypertension    Personal history of chemotherapy    Past Surgical History:  Procedure Laterality Date   ABDOMINAL HYSTERECTOMY     BUNIONECTOMY Bilateral    CHOLECYSTECTOMY     COLONOSCOPY WITH PROPOFOL  N/A 05/31/2019   Procedure: COLONOSCOPY WITH PROPOFOL ;  Surgeon: Marshall Skeeter, MD;  Location: [facility];  Service: Endoscopy;  Laterality: N/A;   HAMMER TOE SURGERY Left    MASTECTOMY Left 2011   positve/had chemo   REDUCTION MAMMAPLASTY Right 2012   REVERSE SHOULDER ARTHROPLASTY Right 10/04/2016   Procedure: REVERSE SHOULDER ARTHROPLASTY;  Surgeon: Elner Hahn, MD;  Location: [facility];  Service: Orthopedics;  Laterality: Right;   Patient Active Problem List   Diagnosis Date Noted   Encounter for long-term (current) use of high-risk medication 08/26/2019   AMS (altered mental status) 07/08/2019   Hypertension    Diabetes mellitus without complication (HCC)    Morbid obesity (HCC)    Acute encephalopathy     Diarrhea    Ataxia 06/08/2019   Gout 06/07/2019   Vertigo 04/17/2019   Generalized weakness 01/01/2019   Vitamin D deficiency 08/21/2018   Chronic gout of multiple sites 08/08/2018   GERD (gastroesophageal reflux disease) 09/13/2017   Chronic diarrhea 03/06/2017   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   Obesity (BMI 30-39.9) 12/31/2015   Depression with anxiety 07/01/2015   Chronic right shoulder pain 08/28/2014   Bilateral hearing loss 06/20/2014   Primary osteoarthritis of both knees 03/13/2014   Asymptomatic varicose veins of both lower extremities 10/29/2013   History of cervical cancer 10/29/2013   Hyperlipidemia 10/29/2013    PCP: Naaman Au , NP   REFERRING PROVIDER:  Naaman Au , NP   REFERRING DIAG: N39.41 (ICD-10-CM) - Urge incontinence   Rationale for Evaluation and Treatment: Rehabilitation  THERAPY DIAG:  Other abnormalities of gait and mobility  Abnormal posture  Other lack of coordination  Pain in right foot  Foot pain right   ONSET DATE:   SUBJECTIVE:      SUBJECTIVE STATEMENT  TODAY:   Pt is with granddtr, Katelyn  Pt had toe infection on 2nd toe of R foot. It got worst while she was visiting her dtr in [place] . Pt  had the toe infection which require surgery to remove it. Pt wore a boot for several weeks.   Pt used a cance for while but now no longer needs it.    Pt 's granddtr reported they have tools in place to promote pt going to the bathroom more frequently but when pt has the urge to go to th e bathroom but there is no motivation to go when pt is alone in the house. Pt 's granddtr expresses she goes when she is urged to go by a family member. Pt reports she goes when she feels the urge. Pt reports her diapers get wet when she can not get there in time.  Pt reports the R foot issue is not slowing down. Pt and granddtr said she can go fast when she needs to go.   Pt's grandtr explains that there is a white  board outside her room with a schedule on it. Pt checks it off when she goes to the bathroom.  Pt 's granddtr says the goal is for pt to go every 2 hours but pt is not going once 2 hours. Pt granddtr says she may not go to the bathroom in the morning upon waking. Granddtr texted pt's dtr-in-law who reports  wetness of pads are heavy soaked during the day as much as during night..   Therapist was able to speak with dtr-in-law on the phone:   Pt had urinary issues prior the Dx of dementia and she                                                                                                                                                      SUBJECTIVE STATEMENT ON EVAL 09/11/23 :  Son , [name] is primary caregiver   1) urinary leakage:  wears brief/ pads per month. Son reports it is better and bed sheet pads were no longer soaked and the smell is not present anymore.  Son reports she was not going up to go to the bathroom. Son reports she is getting better and going to the bathroom. Son is helping pt take medication at 9pm and not right before bed .  Son is encouraging pt to drink water.    2) R foot pain: 4/10 pain with walking from parking lot to clinic .  R foot ulcer that required son and dtr in law to apply medication and bandage to heel. Podiatrist has confirmed it is a compression ulcer and recommended wider toe box shoes.  Pt wilth B bunionectomy and L foot surgery.       PERTINENT HISTORY:   Dementia Stage III-IV   Abdominal hysterectomy    PAIN:  Are you having pain?   PRECAUTIONS:    WEIGHT BEARING RESTRICTIONS:    FALLS:  Has patient fallen in last 6 months? On  07/12/23:  son reported pt was walking up stairs without railing and she felt backward into the grass,.  Pt was assisted to get back up .  Oct 2024, pt was sitting on EOB and fell off her bed.    LIVING ENVIRONMENT: Lives with: son and dtr-in law  Lives in: one story  Stairs: STE 3 with rail  Has following  equipment at home:  cane with 4 prong   OCCUPATION:  Works 3 a week at [employer]    PLOF:   Requires  assistance due to dementia , stage III-IV    PATIENT GOALS:  Improve leakage and foot pain  OBJECTIVE:        HOME EXERCISE PROGRAM: See pt instruction section    ASSESSMENT:  CLINICAL IMPRESSION:    Therapist was able to speak with dtr-in-law on the phone:   She was        Explained/ showed anatomy on deep core, IAP, leakage and posture and alignment role on goals , active listening to both pt and son on pt';s dementia's Hx and past strategies to improve leakage, answered son's questions, provided explanation to how Pelvic PT works from whole person approach with current Pelvic PT.   Explained to son and pt the priority to address uneven spine/ pelvis to help with balance, minimizing risk for falls, gait, and then later directly address pelvic floor. Encouraged them to continue with lifestyle behavior with drinking water, not delaying urination, taking medicaiton with water 2 hours before bed to minimize leakge at night.  Provided resources for dementia. Provided education on shoe shopping ( get size 9.5-10 size due to longer digit II and to get wide toe box to minimize compression ulcer)   Pt benefits from skilled PT.    OBJECTIVE IMPAIRMENTS decreased activity tolerance, decreased coordination, decreased endurance, decreased mobility, difficulty walking, decreased ROM, decreased strength, decreased safety awareness, hypomobility, increased muscle spasms, impaired flexibility, improper body mechanics, postural dysfunction, and pain. scar restrictions   ACTIVITY LIMITATIONS  self-care,  sleep, home chores, work tasks    PARTICIPATION LIMITATIONS:  community, activities    PERSONAL FACTORS   Dementia   are also affecting patient's functional outcome.    REHAB POTENTIAL: Good   CLINICAL DECISION MAKING: Evolving/moderate complexity   EVALUATION  COMPLEXITY: Moderate    PATIENT EDUCATION:    Education details: Showed pt anatomy images. Explained muscles attachments/ connection, physiology of deep core system/ spinal- thoracic-pelvis-lower kinetic chain as they relate to pt's presentation, Sx, and past Hx. Explained what and how these areas of deficits need to be restored to balance and function    See Therapeutic activity / neuromuscular re-education section  Answered pt's questions.   Person educated: Patient Education method: Explanation, Demonstration, Tactile cues, Verbal cues, and Handouts Education comprehension: verbalized understanding, returned demonstration, verbal cues required, tactile cues required, and needs further education     PLAN: PT FREQUENCY: 1x/week   PT DURATION: 10 weeks   PLANNED INTERVENTIONS: Therapeutic exercises, Therapeutic activity, Neuromuscular re-education, Balance training, Gait training, Patient/Family education, Self Care, Joint mobilization, Spinal mobilization, Moist heat, Taping, and Manual therapy, dry needling.   PLAN FOR NEXT SESSION: See clinical impression for plan     GOALS: Goals reviewed with patient? Yes  SHORT TERM GOALS: Target date: 10/09/2023    Pt will demo IND with HEP                    Baseline: Not IND  Goal status: INITIAL   LONG TERM GOALS: Target date: 11/20/2023    1.Pt will demo proper deep core coordination without chest breathing and optimal excursion of diaphragm/pelvic floor in order to promote spinal stability and pelvic floor function  Baseline: dyscoordination Goal status: INITIAL  2.  Pt will demo proper body mechanics in against gravity tasks and ADLs  work tasks, fitness  to minimize straining pelvic floor / back    Baseline: not IND, improper form that places strain on pelvic floor  Goal status: INITIAL    3. Pt will demo increased gait speed > 1.3 m/s with reciprocal gait pattern, longer stride length  in order to ambulate  safely in community and return to fitness routine  Baseline: 0. 87 m/s , excessive sway L pelvis, decreased R stance  Goal status: INITIAL    4. Pt will demo levelled pelvic girdle and shoulder height in order to progress to deep core strengthening HEP and restore mobility at spine, pelvis, gait, posture minimize falls, and improve balance   Baseline: seated, R iliac crest lowered / L lumbar concave curve,  Goal status: INITIAL   5. Increase R great toe ext from 15 deg to > 20 deg and decreased scar restriction in order to improve push off , minimize compression ulcer, and minimize fall risks  Baseline: great toe ext L 30 deg,  R 15 deg , scar limitation  Goal status: INITIAL    Modesto Andreas, PT 11/27/2023, 10:27 AM

## 2023-11-28 NOTE — Patient Instructions (Signed)
 Wear shoe lift in toe box and heel of R shoe, remove if painful or uncomfortable  Follow up with PCP for sleep study to screen for OSA  Research articles emailed on correlation of nocturia and OSA

## 2023-12-05 ENCOUNTER — Ambulatory Visit: Admitting: Neurology

## 2023-12-05 ENCOUNTER — Encounter: Payer: Self-pay | Admitting: Neurology

## 2023-12-05 VITALS — BP 116/59 | HR 59 | Ht 63.0 in | Wt 194.5 lb

## 2023-12-05 DIAGNOSIS — F02A0 Dementia in other diseases classified elsewhere, mild, without behavioral disturbance, psychotic disturbance, mood disturbance, and anxiety: Secondary | ICD-10-CM

## 2023-12-05 DIAGNOSIS — G301 Alzheimer's disease with late onset: Secondary | ICD-10-CM | POA: Diagnosis not present

## 2023-12-05 MED ORDER — DONEPEZIL HCL 5 MG PO TABS: 5.0000 mg | ORAL_TABLET | Freq: Every day | ORAL | 0 refills | Status: DC

## 2023-12-05 NOTE — Patient Instructions (Addendum)
 Continue current medications including memantine 5 mg twice daily Start Aricept 5 mg nightly, side effect may include diarrhea Start exercising, at least 20 minutes a day 5 days a week Will obtain dementia lab including ATN profile, B12, TSH and ApoE 4 genotype  MRI brain without without contrast, I will contact you to go over the results Continue to follow with your PCP and return in 1 year or sooner if worse.    Alzheimer's Disease Alzheimer's disease is a disease that affects the way your brain works. It affects memory and causes changes in how you think, talk, and act. The disease gets worse over time. Alzheimer's disease is a form of dementia. What are the causes? Alzheimer's disease happens when a protein called beta-amyloid forms deposits in the brain. It's not known what causes these to form. The disease may also be caused by a harmful change in a gene that's passed down, or inherited, from one or both parents. Not everyone who gets the changed gene from their parents will get the disease. What increases the risk? Being older than 83 years of age. Being female. Having any of these conditions: High blood pressure. Diabetes. Heart or blood vessel disease. Smoking. Being very overweight. Having a brain injury or a stroke in the past. Having a family history of dementia. What are the signs or symptoms?  Symptoms may happen in three stages, which often overlap. Early stage In this stage, you can still do things on your own. You may still be able to drive, work, and be social. Symptoms in this stage include: Forgetting small things, like a name, words, or what you did recently. Having a hard time with: Paying attention or learning new things. Talking with people. Doing your usual tasks. Solving problems or doing math. Following instructions. Feeling worried or nervous. Not wanting to be around people. Losing interest in doing things. Moderate stage In this stage, you'll  start to need care. Symptoms include: Trouble saying what you're thinking. Memory loss that affects daily life. This can include forgetting: Things that happened recently. If you've taken medicines or eaten. Places you know. You may get lost while walking or driving. To pay bills. To bathe or use the bathroom. Being confused about where you are or what time it is. Trouble judging distance. Changes in how you feel or act. You may be moody, angry, upset, scared, worried, or suspicious. Not thinking clearly or making good choices. You may have delusions or false beliefs. Hallucinations. This means you see, hear, taste, smell, or feel things that aren't real. Severe stage In the severe stage, you'll need help with your personal care and daily activities. Symptoms include: Memory loss getting worse. Personality changes. Not knowing where you are. Physical problems, like trouble walking, sitting, or swallowing. More trouble talking with others. Not being able to control when you pee (urinate) or poop. More changes in how you act. How is this diagnosed? You may need to see a nervous system specialist, called a neurologist, or a health care provider who focuses on the care of older adults. Alzheimer's disease may be diagnosed based on: Your symptoms and medical history. Your provider will talk with you and your family, friends, and caregivers about your history and symptoms. A physical exam. Tests. These may include: Lab tests, such as tests on your blood or pee. Imaging tests. You may have a CT scan, a PET scan, or an MRI. A lumbar puncture. For this test, a sample of the fluid around  the brain and spinal cord is taken and tested. Tests to check your thinking and memory. Genetic testing. This may be done if you get the disease before age 63 or if other family members have had the disease. How is this treated? At this time, there's no cure for Alzheimer's disease. The goals of treatment are  to: Manage symptoms that affect behavior. Make sure you're safe at home. Help manage daily life for you and your caregivers. Treatment may include: Medicines. You may get medicines that can: Slow down how fast the disease gets worse. Help with memory and behavior. Cognitive therapy. This gives you support, training to help with thinking skills, and memory aids. Counseling or spiritual guidance. These can help you deal with the many feelings you may have, such as fear, anger, or feeling alone. Caregivers. These are people who help you with your daily tasks. Family support groups. These allow your family members to learn about the disease, get emotional support, and find out about resources to help take care of you. Follow these instructions at home:  Medicines Take over-the-counter and prescription medicines only as told by your provider. Use a pill organizer or pill reminder to help you keep track of your medicines. Avoid taking medicines that can affect thinking, such as medicines for pain or sleep. Lifestyle Make healthy choices: Be active as told by your provider. Regular exercise may help with symptoms. Do not smoke, vape, or use products with nicotine or tobacco in them. Do not drink alcohol. Eat a healthy diet. When you feel a lot of stress, do something that helps you relax. Try things like yoga or deep breathing. Spend time with people. Drink enough fluid to keep your pee pale yellow. Make sure you sleep well. These tips can help: Try not to take long naps during the day. Take short naps of 30 minutes or less if needed. Keep your bedroom dark and cool. Do not exercise during the few hours before you go to bed. Avoid caffeine products in the afternoon and evening. General instructions Work with your provider to decide: What things you need help with. What your safety needs are. If you were given a bracelet that tracks where you are and shows that you're a person with memory  loss, make sure you wear it at all times. Talk with your provider about whether it's safe for you to drive. Work with your family to make big legal or health decisions. This may include things like advance directives, medical power of attorney, or a living will. Where to find more information There are two ways to contact the Alzheimer's [employer]: Call the 24-hour helpline at 440-130-7279. Visit [employer] Contact a health care provider if: Your medicine causes you to have nausea, vomiting, or trouble with eating. You have mood or behavior changes that are getting worse, such as feeling depressed, worried, or nervous. You have hallucinations. You or your family members are worried about your safety. You're hard to wake up. Your memory suddenly gets worse. Get help right away if: You feel like you may hurt yourself or others. You have thoughts about taking your own life. Take one of these steps: Go to your nearest emergency room. Call 911. Call the National Suicide Prevention Lifeline at 725-224-1443 or 988. Text the Crisis Text Line at (365)421-3374. This information is not intended to replace advice given to you by your health care provider. Make sure you discuss any questions you have with your health care provider. Document Revised: 10/04/2022  Document Reviewed: 10/04/2022 Elsevier Patient Education  2024 [employer].
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
## 2023-12-15 ENCOUNTER — Telehealth: Payer: Self-pay | Admitting: Neurology

## 2023-12-15 NOTE — Telephone Encounter (Signed)
 [employer] medicare [employer]: [id number] exp. 12/15/23-01/13/24 sent to GI [phone]

## 2023-12-16 LAB — ATN PROFILE
A -- Beta-amyloid 42/40 Ratio: 0.093 — ABNORMAL LOW (ref >0.102)
Beta-amyloid 40: 273.26 pg/mL
Beta-amyloid 42: 25.37 pg/mL
N -- NfL, Plasma: 7.22 pg/mL (ref 0.00–9.13)
T -- p-tau181: 2.84 pg/mL — ABNORMAL HIGH (ref 0.00–0.97)

## 2023-12-16 LAB — APOE ALZHEIMER'S RISK

## 2023-12-16 LAB — VITAMIN B12: Vitamin B-12: 977 pg/mL (ref 232–1245)

## 2023-12-16 LAB — TSH: TSH: 2.04 u[IU]/mL (ref 0.450–4.500)

## 2023-12-18 ENCOUNTER — Ambulatory Visit: Payer: Self-pay | Admitting: Neurology

## 2023-12-19 ENCOUNTER — Ambulatory Visit: Payer: Medicare Other | Attending: Gerontology | Admitting: Physical Therapy

## 2023-12-19 ENCOUNTER — Encounter: Payer: Self-pay | Admitting: Physical Therapy

## 2023-12-19 DIAGNOSIS — R2689 Other abnormalities of gait and mobility: Secondary | ICD-10-CM | POA: Insufficient documentation

## 2023-12-19 DIAGNOSIS — R293 Abnormal posture: Secondary | ICD-10-CM | POA: Diagnosis present

## 2023-12-19 DIAGNOSIS — R278 Other lack of coordination: Secondary | ICD-10-CM | POA: Diagnosis present

## 2023-12-19 DIAGNOSIS — M79671 Pain in right foot: Secondary | ICD-10-CM | POA: Insufficient documentation

## 2023-12-19 NOTE — Therapy (Unsigned)
 OUTPATIENT PHYSICAL THERAPY TREATMENT / Discharge Summary across 3 visits    Patient Name: Gabriela Pham MRN: [id number] DOB:1940/07/29, 83 y.o., female Today's Date: 12/19/2023  END OF SESSION:  PT End of Session - 12/19/23 1114     Visit Number 3    Number of Visits 10    Date for PT Re-Evaluation 02/05/24    PT Start Time 1105    PT Stop Time 1145    PT Time Calculation (min) 40 min    Activity Tolerance Patient tolerated treatment well;No increased pain    Behavior During Therapy WFL for tasks assessed/performed             Past Medical History:  Diagnosis Date   Anemia    Anxiety    Arthritis    Breast cancer (HCC) 2011   left-had chemo   Cancer (HCC) 1973   cervical ca   Cataract cortical, senile    Cholelithiasis without obstruction    Depression    Diabetes mellitus without complication (HCC)    Diarrhea    Dyspnea    GERD (gastroesophageal reflux disease)    Gout    History of chicken pox    History of colon polyps    Hyperlipidemia    Hypertension    Personal history of chemotherapy    Past Surgical History:  Procedure Laterality Date   ABDOMINAL HYSTERECTOMY     BUNIONECTOMY Bilateral    CHOLECYSTECTOMY     COLONOSCOPY WITH PROPOFOL  N/A 05/31/2019   Procedure: COLONOSCOPY WITH PROPOFOL ;  Surgeon: Marshall Skeeter, MD;  Location: [facility];  Service: Endoscopy;  Laterality: N/A;   HAMMER TOE SURGERY Left    MASTECTOMY Left 2011   positve/had chemo   REDUCTION MAMMAPLASTY Right 2012   REVERSE SHOULDER ARTHROPLASTY Right 10/04/2016   Procedure: REVERSE SHOULDER ARTHROPLASTY;  Surgeon: Elner Hahn, MD;  Location: [facility];  Service: Orthopedics;  Laterality: Right;   Patient Active Problem List   Diagnosis Date Noted   Encounter for long-term (current) use of high-risk medication 08/26/2019   AMS (altered mental status) 07/08/2019   Hypertension    Diabetes mellitus without complication (HCC)    Morbid obesity (HCC)     Acute encephalopathy    Diarrhea    Ataxia 06/08/2019   Gout 06/07/2019   Vertigo 04/17/2019   Generalized weakness 01/01/2019   Vitamin D deficiency 08/21/2018   Chronic gout of multiple sites 08/08/2018   GERD (gastroesophageal reflux disease) 09/13/2017   Chronic diarrhea 03/06/2017   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  Foot pain right   ONSET DATE:   SUBJECTIVE:      SUBJECTIVE STATEMENT  TODAY:   Pt is with son today  Pt's son reports they have not been consistent with wearing shoe lift in R shoe,  They bought new shoes that fit wider and is made of fabric as recommended by doctor.  Pt's son reports pt is asking pt why she is not going to the bathroom first thing in the morning. Pt has not gone between 5am to 9am.   Pt's son reports that pt will say she went to the bathroom and she has not.  Pt tearful expressing to son she is not lying to son and dtr.   Pt reports she does not sense when she needs to go to the bathroom after drinking water.    Pt 's son would like to try if pt is not wearing depends , she will actually go to the bathroom to urinate instead of urinating in the diapers. Pt 's son also thought about having a video camera outside bathroom door to ensure pt has gone every 2 hours.                                                                                                                                                        SUBJECTIVE STATEMENT ON EVAL 09/11/23 :  Son , [name] is primary caregiver   1) urinary leakage:  wears brief/ pads per month. Son reports it is better and bed sheet pads were no longer soaked and the smell is not present anymore.  Son reports she was not going up to go to the bathroom. Son reports she is getting better and going to the bathroom. Son is helping pt take medication at 9pm and not right before bed .  Son is encouraging pt to drink water.    2) R foot pain: 4/10 pain with walking from parking lot to clinic .  R foot ulcer that required son and dtr in law to apply medication and bandage to heel. Podiatrist has confirmed it is a compression ulcer and recommended wider toe box shoes.  Pt wilth B bunionectomy and L foot surgery.       PERTINENT HISTORY:   Dementia Stage III-IV   Abdominal hysterectomy    PAIN:  Are you having pain? No  PRECAUTIONS:    WEIGHT BEARING RESTRICTIONS:    FALLS:  Has patient fallen in last 6 months? On 07/12/23:  son reported pt was walking up stairs without railing and she felt backward into the grass,.  Pt was assisted to get back up .  Oct 2024, pt was sitting on EOB and fell off her bed.    LIVING ENVIRONMENT: Lives with: son and dtr-in law  Lives in: one story  Stairs: STE 3 with rail  Has following equipment at home:  cane with 4 prong   OCCUPATION:  Works 3 a week at [employer]    PLOF:   Requires  assistance due to dementia , stage III-IV    PATIENT GOALS:  Improve leakage and foot pain  OBJECTIVE:    [facility] PT Assessment - 12/20/23 1054  Ambulation/Gait   Gait Comments 1.3 m/s without cane         Shoe lifts in toe box and heel in R shoe properly placed , pt wearing wider toe box shoe     [facility] Adult PT Treatment/Exercise - 12/20/23 0824       Self-Care   Other Self-Care Comments  active listening to the pt and son's conversation with each  other about pt''s urinary issues,  provided information about the relationship bwtween dementia and urinary incontinence,  discussed maintaing shoe lift in R shoe to address for leg length difference which has positively helped with gait speed and mobility and will continue to help with healing her  toe surgery,  provided compassion and encourage to son and pt , provided information to son about [employer]  and her website/ blog re: caregiver strategies with interacting with persons with dementia at different stages of the condition, encouraged pt's son to contact TEFL teacher for more support and resources for how families can care for family with dementia, possibility of hiring an home aid for assisting pt to bathroom at intervals to minimize soaked diapers/ skin breakdown/ infections.  Son voiced agreement to look into resources provided.      Therapeutic Activites    Other Therapeutic Activities reassessed goals, assessed gait speed, spinal and pelvic alignment, and assessed shoe lifts in R shoe toe box and heels which were fitted correctly in new shoe.                HOME EXERCISE PROGRAM: See pt instruction section    ASSESSMENT:  CLINICAL IMPRESSION:  Pt met 2 goals. Pt partially met gait speed goal as she showed improved gait speed with shoe lift in R shoe toe box and heel:  12/19/23 ( 3rd visit) 1.3 m/s without cane compared to eval 09/11/23 0. 87 m/s. Pt no longer showed excessive sway L pelvis, decreased R stance in gait has pelvic alignment was restored which will help with getting to the bathroom and minimizing fall risks as well as further healing following her toe surgery. .      Therapist recommended:  pt to undergo sleep study to screen for OSA given correlation between nocturia and OSA.  Son messaged PCP re: sleep study. Pt has wet diapers in the middle of the night, family reports snoring and paused breathing,   Pt had urinary issues prior the Dx of dementia and she  was not compliant with exercises.  Therefore, explained to family that kegel exercises will not be part of POC.  Encouraged pt and son to follow up with resources provided via email ( see pt instruction section) to understand the role of dementia on incontinence, consider utilizing home aid support to assist with pt on a bladder schedule to minimize wet diapers. Also provided resources for [employer] and other dementia support information locally as well as online resources for care givers.     Pt is d/c at this time.     OBJECTIVE IMPAIRMENTS decreased activity tolerance, decreased coordination, decreased endurance, decreased mobility, difficulty walking, decreased ROM, decreased strength, decreased safety awareness, hypomobility, increased muscle spasms, impaired flexibility, improper body mechanics, postural dysfunction, and pain. scar restrictions   ACTIVITY LIMITATIONS  self-care,  sleep, home chores, work tasks    PARTICIPATION LIMITATIONS:  community, activities    PERSONAL FACTORS   Dementia   are also affecting patient's functional outcome.    REHAB POTENTIAL: Good   CLINICAL DECISION MAKING: Evolving/moderate complexity  EVALUATION COMPLEXITY: Moderate    PATIENT EDUCATION:    Education details: Showed pt anatomy images. Explained muscles attachments/ connection, physiology of deep core system/ spinal- thoracic-pelvis-lower kinetic chain as they relate to pt's presentation, Sx, and past Hx. Explained what and how these areas of deficits need to be restored to balance and function    See Therapeutic activity / neuromuscular re-education section  Answered pt's questions.   Person educated: Patient Education method: Explanation, Demonstration, Tactile cues, Verbal cues, and Handouts Education comprehension: verbalized understanding, returned demonstration, verbal cues required, tactile cues required, and needs further education     PLAN: PT FREQUENCY: 1x/week    PT DURATION: 10 weeks   PLANNED INTERVENTIONS: Therapeutic exercises, Therapeutic activity, Neuromuscular re-education, Balance training, Gait training, Patient/Family education, Self Care, Joint mobilization, Spinal mobilization, Moist heat, Taping, and Manual therapy, dry needling.   PLAN FOR NEXT SESSION: See clinical impression for plan     GOALS: Goals reviewed with patient? Yes  SHORT TERM GOALS: Target date: 10/09/2023    Pt will demo IND with HEP                    Baseline: Not IND            Goal status: INITIAL   LONG TERM GOALS: Target date: 02/05/2024      1.Pt will demo proper deep core coordination without chest breathing and optimal excursion of diaphragm/pelvic floor in order to promote spinal stability and pelvic floor function  Baseline: dyscoordination Goal status: deferred   2.  Pt will demo proper body mechanics in against gravity tasks and ADLs  work tasks, fitness  to minimize straining pelvic floor / back    Baseline: not IND, improper form that places strain on pelvic floor  Goal status:  Deferred    3. Pt will demo increased gait speed > 1.3 m/s with reciprocal gait pattern, longer stride length  in order to ambulate safely in community and return to fitness routine  Baseline: 0. 87 m/s , excessive sway L pelvis, decreased R stance  Goal status: Partially Met ( 11/27/23:  0.96 m/s reciporcal gait)     4. Pt will demo levelled pelvic girdle and shoulder height in order to progress to deep core strengthening HEP and restore mobility at spine, pelvis, gait, posture minimize falls, and improve balance   Baseline: seated, R iliac crest lowered / L lumbar concave curve,  Goal status:MET ( with 11/27/23: shoe lift in toe box and heel)    5. Increase R great toe ext from 15 deg to > 20 deg and decreased scar restriction in order to improve push off , minimize compression ulcer, and minimize fall risks  Baseline: great toe ext L 30 deg,  R 15 deg , scar  limitation  Goal status:  did not assess   6. Pt will undergo sleep study to rule in and out OSA to improve nocturia Baseline:  wet diapers in the middle of the night, family reports snoring and paused breathing,  Goal Status: MET ( son messaged PCP)      Modesto Andreas, PT 12/19/2023, 11:19 AM

## 2023-12-20 NOTE — Patient Instructions (Addendum)
 Resources for caregivers  [facility] [facility] [phone] www.dementianc.org  [employer] [phone] www.alznc.org   [email or web address]   [email or web address]  ( [facility])  ____   Information about Dementia and incontinence:    [email or web address].se   [email or web address].[email or web address]   ___  Replace shoe lift in R shoe every 6 months  to maintain levelled pelvic girdle which helps with gait speed and mobility , getting to the bathroom      [employer] Adjustable Heel Lift at [email or web address]     Two pieces for the  Right shoe    One Large, for toe box  One medium for the heel     ** It comes with thick piece and   be sure to peel top piece off and use that piece in the shoe  ** REPLACE every 6 months as they get worn down

## 2023-12-30 ENCOUNTER — Other Ambulatory Visit: Payer: Self-pay | Admitting: Neurology

## 2024-12-05 ENCOUNTER — Ambulatory Visit: Admitting: Neurology
